# Patient Record
Sex: Female | Born: 1960 | Race: Black or African American | Hispanic: No | Marital: Single | Smoking: Current some day smoker
Health system: Southern US, Community
[De-identification: ages and names within clinical notes are randomized; demographics above are authoritative.]

## PROBLEM LIST (undated history)

## (undated) DIAGNOSIS — E559 Vitamin D deficiency, unspecified: Secondary | ICD-10-CM

## (undated) DIAGNOSIS — E785 Hyperlipidemia, unspecified: Secondary | ICD-10-CM

## (undated) HISTORY — DX: Hyperlipidemia, unspecified: E78.5

## (undated) HISTORY — DX: Vitamin D deficiency, unspecified: E55.9

---

## 2011-08-21 ENCOUNTER — Emergency Department: Payer: Self-pay | Admitting: Emergency Medicine

## 2013-04-14 ENCOUNTER — Emergency Department: Payer: Self-pay | Admitting: Emergency Medicine

## 2013-04-14 LAB — COMPREHENSIVE METABOLIC PANEL
Albumin: 3.3 g/dL — ABNORMAL LOW (ref 3.4–5.0)
Alkaline Phosphatase: 53 U/L (ref 50–136)
BUN: 5 mg/dL — ABNORMAL LOW (ref 7–18)
Bilirubin,Total: 0.2 mg/dL (ref 0.2–1.0)
Calcium, Total: 9 mg/dL (ref 8.5–10.1)
Chloride: 105 mmol/L (ref 98–107)
Co2: 26 mmol/L (ref 21–32)
Creatinine: 0.83 mg/dL (ref 0.60–1.30)
EGFR (African American): >60
EGFR (Non-African Amer.): >60
Potassium: 3.9 mmol/L (ref 3.5–5.1)
SGOT(AST): 19 U/L (ref 15–37)
SGPT (ALT): 14 U/L (ref 12–78)
Total Protein: 6.6 g/dL (ref 6.4–8.2)

## 2013-04-14 LAB — URINALYSIS, COMPLETE
Bacteria: NONE SEEN
Glucose,UR: NEGATIVE mg/dL (ref 0–75)
Ph: 6 (ref 4.5–8.0)
Protein: NEGATIVE
RBC,UR: 2 /HPF (ref 0–5)
Specific Gravity: 1.005 (ref 1.003–1.030)
Squamous Epithelial: 10
WBC UR: 1 /HPF (ref 0–5)

## 2013-04-14 LAB — CBC
HGB: 10 g/dL — ABNORMAL LOW (ref 12.0–16.0)
MCH: 27.3 pg (ref 26.0–34.0)
MCHC: 32.8 g/dL (ref 32.0–36.0)
MCV: 83 fL (ref 80–100)
Platelet: 288 10*3/uL (ref 150–440)
RBC: 3.65 10*6/uL — ABNORMAL LOW (ref 3.80–5.20)
RDW: 17.1 % — ABNORMAL HIGH (ref 11.5–14.5)
WBC: 4.1 10*3/uL (ref 3.6–11.0)

## 2013-04-14 LAB — TROPONIN I: Troponin-I: <0.02 ng/mL

## 2014-08-09 ENCOUNTER — Emergency Department: Payer: Self-pay | Admitting: Emergency Medicine

## 2015-02-08 ENCOUNTER — Emergency Department
Admission: EM | Admit: 2015-02-08 | Discharge: 2015-02-08 | Disposition: A | Discharge status: Home / Self Care | Payer: Self-pay | Attending: Emergency Medicine | Admitting: Emergency Medicine

## 2015-02-08 ENCOUNTER — Encounter: Payer: Self-pay | Admitting: Emergency Medicine

## 2015-02-08 DIAGNOSIS — Z720 Tobacco use: Secondary | ICD-10-CM | POA: Insufficient documentation

## 2015-02-08 DIAGNOSIS — Y9389 Activity, other specified: Secondary | ICD-10-CM | POA: Insufficient documentation

## 2015-02-08 DIAGNOSIS — X58XXXA Exposure to other specified factors, initial encounter: Secondary | ICD-10-CM | POA: Insufficient documentation

## 2015-02-08 DIAGNOSIS — S46011A Strain of muscle(s) and tendon(s) of the rotator cuff of right shoulder, initial encounter: Secondary | ICD-10-CM | POA: Insufficient documentation

## 2015-02-08 DIAGNOSIS — Y998 Other external cause status: Secondary | ICD-10-CM | POA: Insufficient documentation

## 2015-02-08 DIAGNOSIS — S43421A Sprain of right rotator cuff capsule, initial encounter: Secondary | ICD-10-CM | POA: Insufficient documentation

## 2015-02-08 DIAGNOSIS — Y9289 Other specified places as the place of occurrence of the external cause: Secondary | ICD-10-CM | POA: Insufficient documentation

## 2015-02-08 MED ORDER — NAPROXEN 500 MG PO TABS: 500.0000 mg | ORAL_TABLET | Freq: Two times a day (BID) | ORAL | Status: DC

## 2015-02-08 MED ORDER — HYDROCODONE-ACETAMINOPHEN 5-325 MG PO TABS: 1.0000 | ORAL_TABLET | ORAL | Status: DC | PRN

## 2015-02-08 NOTE — ED Notes (Signed)
C/o right shoulder pain and unable to move shoulder without assistance.  Denies any injury. Pain started about 2 weeks ago and progressively has become worse. Patient states she has taken several OTC medications to relieve pain but is not working.

## 2015-02-08 NOTE — Discharge Instructions (Signed)
Rotator Cuff Injury Rotator cuff injury is any type of injury to the set of muscles and tendons that make up the stabilizing unit of your shoulder. This unit holds the ball of your upper arm bone (humerus) in the socket of your shoulder blade (scapula).  CAUSES Injuries to your rotator cuff most commonly come from sports or activities that cause your arm to be moved repeatedly over your head. Examples of this include throwing, weight lifting, swimming, or racquet sports. Long lasting (chronic) irritation of your rotator cuff can cause soreness and swelling (inflammation), bursitis, and eventual damage to your tendons, such as a tear (rupture). SIGNS AND SYMPTOMS Acute rotator cuff tear:  Sudden tearing sensation followed by severe pain shooting from your upper shoulder down your arm toward your elbow.  Decreased range of motion of your shoulder because of pain and muscle spasm.  Severe pain.  Inability to raise your arm out to the side because of pain and loss of muscle power (large tears). Chronic rotator cuff tear:  Pain that usually is worse at night and may interfere with sleep.  Gradual weakness and decreased shoulder motion as the pain worsens.  Decreased range of motion. Rotator cuff tendinitis:  Deep ache in your shoulder and the outside upper arm over your shoulder.  Pain that comes on gradually and becomes worse when lifting your arm to the side or turning it inward. DIAGNOSIS Rotator cuff injury is diagnosed through a medical history, physical exam, and imaging exam. The medical history helps determine the type of rotator cuff injury. Your health care provider will look at your injured shoulder, feel the injured area, and ask you to move your shoulder in different positions. X-ray exams typically are done to rule out other causes of shoulder pain, such as fractures. MRI is the exam of choice for the most severe shoulder injuries because the images show muscles and tendons.    TREATMENT  Chronic tear:  Medicine for pain, such as acetaminophen or ibuprofen.  Physical therapy and range-of-motion exercises may be helpful in maintaining shoulder function and strength.  Steroid injections into your shoulder joint.  Surgical repair of the rotator cuff if the injury does not heal with noninvasive treatment. Acute tear:  Anti-inflammatory medicines such as ibuprofen and naproxen to help reduce pain and swelling.  A sling to help support your arm and rest your rotator cuff muscles. Long-term use of a sling is not advised. It may cause significant stiffening of the shoulder joint.  Surgery may be considered within a few weeks, especially in younger, active people, to return the shoulder to full function.  Indications for surgical treatment include the following:  Age younger than 60 years.  Rotator cuff tears that are complete.  Physical therapy, rest, and anti-inflammatory medicines have been used for 6-8 weeks, with no improvement.  Employment or sporting activity that requires constant shoulder use. Tendinitis:  Anti-inflammatory medicines such as ibuprofen and naproxen to help reduce pain and swelling.  A sling to help support your arm and rest your rotator cuff muscles. Long-term use of a sling is not advised. It may cause significant stiffening of the shoulder joint.  Severe tendinitis may require:  Steroid injections into your shoulder joint.  Physical therapy.  Surgery. HOME CARE INSTRUCTIONS   Apply ice to your injury:  Put ice in a plastic bag.  Place a towel between your skin and the bag.  Leave the ice on for 20 minutes, 2-3 times a day.  If you   have a shoulder immobilizer (sling and straps), wear it until told otherwise by your health care provider.  You may want to sleep on several pillows or in a recliner at night to lessen swelling and pain.  Only take over-the-counter or prescription medicines for pain, discomfort, or fever as  directed by your health care provider.  Do simple hand squeezing exercises with a soft rubber ball to decrease hand swelling. SEEK MEDICAL CARE IF:   Your shoulder pain increases, or new pain or numbness develops in your arm, hand, or fingers.  Your hand or fingers are colder than your other hand. SEEK IMMEDIATE MEDICAL CARE IF:   Your arm, hand, or fingers are numb or tingling.  Your arm, hand, or fingers are increasingly swollen and painful, or they turn white or blue. MAKE SURE YOU:  Understand these instructions.  Will watch your condition.  Will get help right away if you are not doing well or get worse. Document Released: 06/16/2000 Document Revised: 06/24/2013 Document Reviewed: 01/29/2013 [employer] Patient Information 2015 [employer]. This information is not intended to replace advice given to you by your health care provider. Make sure you discuss any questions you have with your health care provider.  

## 2015-02-08 NOTE — ED Provider Notes (Signed)
[facility] Emergency Department Provider Note  ____________________________________________  Time seen: On arrival  I have reviewed the triage vital signs and the nursing notes.   HISTORY  Chief Complaint Arm Pain    HPI Margaret Dean is a 54 y.o. female who presents with complaints of right shoulder pain for approximately 2-3 weeks. She does not remember any sort of injury. She denies fevers chills. No neck pain. No tingling in her hand. No weakness in her extremity. She is unable to lift her arm above 90 without significant pain.    History reviewed. No pertinent past medical history.  There are no active problems to display for this patient.   History reviewed. No pertinent past surgical history.  Current Outpatient Rx  Name  Route  Sig  Dispense  Refill  . HYDROcodone-acetaminophen (NORCO/VICODIN) 5-325 MG per tablet   Oral   Take 1 tablet by mouth every 4 (four) hours as needed for moderate pain.   20 tablet   0   . naproxen (NAPROSYN) 500 MG tablet   Oral   Take 1 tablet (500 mg total) by mouth 2 (two) times daily with a meal.   20 tablet   2     Allergies Review of patient's allergies indicates no known allergies.  No family history on file.  Social History History  Substance Use Topics  . Smoking status: Current Every Day Smoker -- 0.50 packs/day    Types: Cigarettes  . Smokeless tobacco: Not on file  . Alcohol Use: Not on file     Comment: pt reports having a glass of wine once a month    Review of Systems  Constitutional: Negative for fever. Eyes: Negative for visual changes. ENT: Negative for neck pain Musculoskeletal: Negative for back pain. Skin: Negative for rash. No swelling Neurological: Negative for headaches or focal weakness   ____________________________________________   PHYSICAL EXAM:  VITAL SIGNS: ED Triage Vitals  Enc Vitals Group     BP 02/08/15 1037 130/86 mmHg     Pulse Rate 02/08/15  1037 63     Resp --      Temp 02/08/15 1037 98.8 F (37.1 C)     Temp Source 02/08/15 1037 Oral     SpO2 02/08/15 1037 97 %     Weight 02/08/15 1037 160 lb (72.576 kg)     Height 02/08/15 1037 5\' 5"  (1.651 m)     Head Cir --      Peak Flow --      Pain Score 02/08/15 1038 10     Pain Loc --      Pain Edu? --      Excl. in GC? --      Constitutional: Alert and oriented. Well appearing and in no distress. Eyes: Conjunctivae are normal.  ENT   Head: Normocephalic and atraumatic.   Mouth/Throat: Mucous membranes are moist. Cardiovascular: Normal rate, regular rhythm.  Respiratory: Normal respiratory effort without tachypnea nor retractions.  Gastrointestinal: Soft and non-tender in all quadrants. No distention. There is no CVA tenderness. Musculoskeletal: Nontender with normal range of motion in all extremities. Patient unable to raise right upper extremity above 90 secondary to pain. She has some mild tenderness to palpation at the site of the right deltoid insertion. Normal triceps and biceps movements. Neurologic:  Normal speech and language. No gross focal neurologic deficits are appreciated. Skin:  Skin is warm, dry and intact. No rash noted. Psychiatric: Mood and affect are normal. Patient exhibits appropriate  insight and judgment.  ____________________________________________    LABS (pertinent positives/negatives)  Labs Reviewed - No data to display  ____________________________________________     ____________________________________________    RADIOLOGY I have personally reviewed any xrays that were ordered on this patient: None  ____________________________________________   PROCEDURES  Procedure(s) performed: none   ____________________________________________   INITIAL IMPRESSION / ASSESSMENT AND PLAN / ED COURSE  Pertinent labs & imaging results that were available during my care of the patient were reviewed by me and considered in my  medical decision making (see chart for details).  History of present illness an exam suspicious for rotator cuff injury. We will provide analgesic's and referred to orthopedics for further workup  ____________________________________________   FINAL CLINICAL IMPRESSION(S) / ED DIAGNOSES  Final diagnoses:  Rotator cuff (capsule) sprain and strain, right, initial encounter     Jene Every, MD 02/08/15 1132

## 2015-02-08 NOTE — ED Notes (Signed)
Pt reports R upper arm pain that started 2 weeks ago.  Pt unable use arm for normal everyday activities without pain.  Decreased range of motion.  Pt denies any known injury.

## 2015-11-16 ENCOUNTER — Emergency Department
Admission: EM | Admit: 2015-11-16 | Discharge: 2015-11-16 | Disposition: A | Discharge status: Home / Self Care | Payer: Self-pay | Attending: Emergency Medicine | Admitting: Emergency Medicine

## 2015-11-16 ENCOUNTER — Encounter: Payer: Self-pay | Admitting: Emergency Medicine

## 2015-11-16 DIAGNOSIS — A084 Viral intestinal infection, unspecified: Secondary | ICD-10-CM | POA: Insufficient documentation

## 2015-11-16 DIAGNOSIS — F17210 Nicotine dependence, cigarettes, uncomplicated: Secondary | ICD-10-CM | POA: Insufficient documentation

## 2015-11-16 DIAGNOSIS — Z79899 Other long term (current) drug therapy: Secondary | ICD-10-CM | POA: Insufficient documentation

## 2015-11-16 DIAGNOSIS — Z791 Long term (current) use of non-steroidal anti-inflammatories (NSAID): Secondary | ICD-10-CM | POA: Insufficient documentation

## 2015-11-16 LAB — LIPASE, BLOOD: LIPASE: 26 U/L (ref 11–51)

## 2015-11-16 LAB — COMPREHENSIVE METABOLIC PANEL
ALK PHOS: 39 U/L (ref 38–126)
ALT: 13 U/L — AB (ref 14–54)
AST: 16 U/L (ref 15–41)
Albumin: 3.8 g/dL (ref 3.5–5.0)
Anion gap: 5 (ref 5–15)
BILIRUBIN TOTAL: 0.4 mg/dL (ref 0.3–1.2)
BUN: 6 mg/dL (ref 6–20)
CALCIUM: 9.6 mg/dL (ref 8.9–10.3)
CHLORIDE: 106 mmol/L (ref 101–111)
CO2: 26 mmol/L (ref 22–32)
CREATININE: 0.68 mg/dL (ref 0.44–1.00)
Glucose, Bld: 97 mg/dL (ref 65–99)
Potassium: 3.9 mmol/L (ref 3.5–5.1)
Sodium: 137 mmol/L (ref 135–145)
TOTAL PROTEIN: 7.3 g/dL (ref 6.5–8.1)

## 2015-11-16 LAB — URINALYSIS COMPLETE WITH MICROSCOPIC (ARMC ONLY)
BILIRUBIN URINE: NEGATIVE
Bacteria, UA: NONE SEEN
Glucose, UA: NEGATIVE mg/dL
Hgb urine dipstick: NEGATIVE
KETONES UR: NEGATIVE mg/dL
LEUKOCYTES UA: NEGATIVE
Nitrite: NEGATIVE
PH: 8 (ref 5.0–8.0)
PROTEIN: NEGATIVE mg/dL
SPECIFIC GRAVITY, URINE: 1.005 (ref 1.005–1.030)

## 2015-11-16 LAB — CBC
HCT: 32.2 % — ABNORMAL LOW (ref 35.0–47.0)
Hemoglobin: 10.3 g/dL — ABNORMAL LOW (ref 12.0–16.0)
MCH: 27.4 pg (ref 26.0–34.0)
MCHC: 32 g/dL (ref 32.0–36.0)
MCV: 85.8 fL (ref 80.0–100.0)
PLATELETS: 354 10*3/uL (ref 150–440)
RBC: 3.76 MIL/uL — AB (ref 3.80–5.20)
RDW: 15.6 % — AB (ref 11.5–14.5)
WBC: 4.4 10*3/uL (ref 3.6–11.0)

## 2015-11-16 LAB — POCT PREGNANCY, URINE: Preg Test, Ur: NEGATIVE

## 2015-11-16 MED ORDER — PROMETHAZINE HCL 12.5 MG PO TABS: 12.5000 mg | ORAL_TABLET | Freq: Four times a day (QID) | ORAL | Status: DC | PRN

## 2015-11-16 NOTE — ED Provider Notes (Signed)
[facility] Emergency Department Provider Note  ____________________________________________  Time seen: Approximately 2:37 PM  I have reviewed the triage vital signs and the nursing notes.   HISTORY  Chief Complaint Diarrhea; Vomiting; and Abdominal Pain    HPI Margaret Dean is a 55 y.o. female with no significant past medical history other than current tobacco use who presents with gradual onset of nausea, vomiting, diarrhea, and lower abdominal cramping for about 3 days.  She reports that her niece and nephew visited a day or 2 prior to the onset of symptoms and they have had all the same symptoms.  She reports that she is not able to work yesterday or today due to the frequent diarrhea.  She reports that the symptoms are severe in terms of the frequency of vomiting and diarrhea, although she states the vomiting is nearly resolved and is more the diarrhea that remains a problem.  There has been no blood in her stool, just loose and watery.  The abdominal cramping is moderate and occurs acutely and results in the need to run to the bathroom quickly.  She denies fever/chills, chest pain, shortness of breath, dysuria.  She has not been feeling lightheaded or dizzy.  She reports that she has been eating saltine crackers and trying to drink plenty of water.   History reviewed. No pertinent past medical history.  There are no active problems to display for this patient.   History reviewed. No pertinent past surgical history.  Current Outpatient Rx  Name  Route  Sig  Dispense  Refill  . HYDROcodone-acetaminophen (NORCO/VICODIN) 5-325 MG per tablet   Oral   Take 1 tablet by mouth every 4 (four) hours as needed for moderate pain.   20 tablet   0   . naproxen (NAPROSYN) 500 MG tablet   Oral   Take 1 tablet (500 mg total) by mouth 2 (two) times daily with a meal.   20 tablet   2   . promethazine (PHENERGAN) 12.5 MG tablet   Oral   Take 1 tablet (12.5 mg  total) by mouth every 6 (six) hours as needed for nausea or vomiting.   30 tablet   0     Allergies Review of patient's allergies indicates no known allergies.  History reviewed. No pertinent family history.  Social History Social History  Substance Use Topics  . Smoking status: Current Every Day Smoker -- 0.50 packs/day    Types: Cigarettes  . Smokeless tobacco: None  . Alcohol Use: None     Comment: pt reports having a glass of wine once a month    Review of Systems Constitutional: No fever/chills Eyes: No visual changes. ENT: No sore throat. Cardiovascular: Denies chest pain. Respiratory: Denies shortness of breath. Gastrointestinal: N/V/D w/ abd cramps Genitourinary: Negative for dysuria. Musculoskeletal: Negative for back pain. Skin: Negative for rash. Neurological: Negative for headaches, focal weakness or numbness.  10-point ROS otherwise negative.  ____________________________________________   PHYSICAL EXAM:  VITAL SIGNS: ED Triage Vitals  Enc Vitals Group     BP 11/16/15 1121 141/90 mmHg     Pulse Rate 11/16/15 1121 77     Resp 11/16/15 1121 20     Temp 11/16/15 1121 98.9 F (37.2 C)     Temp Source 11/16/15 1121 Oral     SpO2 11/16/15 1121 100 %     Weight 11/16/15 1121 165 lb (74.844 kg)     Height 11/16/15 1121  (1.651 m)  Head Cir --      Peak Flow --      Pain Score 11/16/15 1126 7     Pain Loc --      Pain Edu? --      Excl. in GC? --     Constitutional: Alert and oriented. Well appearing and in no acute distress but tearful. Eyes: Conjunctivae are normal. PERRL. EOMI. Head: Atraumatic. Nose: No congestion/rhinnorhea. Mouth/Throat: Mucous membranes are moist.  Oropharynx non-erythematous. Neck: No stridor.  No meningeal signs.   Cardiovascular: Normal rate, regular rhythm. Good peripheral circulation. Grossly normal heart sounds.   Respiratory: Normal respiratory effort.  No retractions. Lungs CTAB. Gastrointestinal: Soft and  nontender. No distention.  Musculoskeletal: No lower extremity tenderness nor edema. No gross deformities of extremities. Neurologic:  Normal speech and language. No gross focal neurologic deficits are appreciated.  Skin:  Skin is warm, dry and intact. No rash noted. Psychiatric: Mood and affect are tearful, reports she is under a lot of stress and "just not feeling well"  ____________________________________________   LABS (all labs ordered are listed, but only abnormal results are displayed)  Labs Reviewed  COMPREHENSIVE METABOLIC PANEL - Abnormal; Notable for the following:    ALT 13 (*)    All other components within normal limits  CBC - Abnormal; Notable for the following:    RBC 3.76 (*)    Hemoglobin 10.3 (*)    HCT 32.2 (*)    RDW 15.6 (*)    All other components within normal limits  URINALYSIS COMPLETEWITH MICROSCOPIC ([facility] ONLY) - Abnormal; Notable for the following:    Color, Urine STRAW (*)    APPearance CLEAR (*)    Squamous Epithelial / LPF 0-5 (*)    All other components within normal limits  LIPASE, BLOOD  POC URINE PREG, ED  POCT PREGNANCY, URINE   ____________________________________________  EKG  None ____________________________________________  RADIOLOGY   No results found.  ____________________________________________   PROCEDURES  Procedure(s) performed: None  Critical Care performed: No ____________________________________________   INITIAL IMPRESSION / ASSESSMENT AND PLAN / ED COURSE  Pertinent labs & imaging results that were available during my care of the patient were reviewed by me and considered in my medical decision making (see chart for details).  The patient's vital signs are normal and her lab workup was reassuring.  She and I both agree she does not need any additional workup at this time including no stool cultures.  I gave her my usual and customary management advice and return precautions.  She understands and  agrees.   ____________________________________________  FINAL CLINICAL IMPRESSION(S) / ED DIAGNOSES  Final diagnoses:  Viral gastroenteritis     MEDICATIONS GIVEN DURING THIS VISIT:  Medications - No data to display   NEW OUTPATIENT MEDICATIONS STARTED DURING THIS VISIT:  Discharge Medication List as of 11/16/2015  2:51 PM    START taking these medications   Details  promethazine (PHENERGAN) 12.5 MG tablet Take 1 tablet (12.5 mg total) by mouth every 6 (six) hours as needed for nausea or vomiting., Starting 11/16/2015, Until Discontinued, Print          Note:  This document was prepared using Dragon voice recognition software and may include unintentional dictation errors.   Loleta Roseory Lynasia Meloche, MD 11/16/15 [phone]

## 2015-11-16 NOTE — ED Notes (Signed)
Pt informed to return if any life threatening symptoms occur.  

## 2015-11-16 NOTE — Discharge Instructions (Signed)
We believe your symptoms are caused by either a viral infection or possible a bad food exposure.  Either way, since your symptoms have improved, we feel it is safe for you to go home and follow up with your regular doctor.  Please read the included information and stick to a bland diet for the next two days.  Drink plenty of clear fluids, and if you were provided with a prescription, please take it according to the label instructions.   ° °If you develop any new or worsening symptoms, including persistent vomiting not controlled with medication, fever greater than 101, severe or worsening abdominal pain, or other symptoms that concern you, please return immediately to the Emergency Department. ° °Viral Gastroenteritis  °Viral gastroenteritis is also known as stomach flu. This condition affects the stomach and intestinal tract. It can cause sudden diarrhea and vomiting. The illness typically lasts 3 to 8 days. Most people develop an immune response that eventually gets rid of the virus. While this natural response develops, the virus can make you quite ill.  °CAUSES  °Many different viruses can cause gastroenteritis, such as rotavirus or noroviruses. You can catch one of these viruses by consuming contaminated food or water. You may also catch a virus by sharing utensils or other personal items with an infected person or by touching a contaminated surface.  °SYMPTOMS  °The most common symptoms are diarrhea and vomiting. These problems can cause a severe loss of body fluids (dehydration) and a body salt (electrolyte) imbalance. Other symptoms may include:  °Fever.  °Headache.  °Fatigue.  °Abdominal pain. °DIAGNOSIS  °Your caregiver can usually diagnose viral gastroenteritis based on your symptoms and a physical exam. A stool sample may also be taken to test for the presence of viruses or other infections.  °TREATMENT  °This illness typically goes away on its own. Treatments are aimed at rehydration. The most serious  cases of viral gastroenteritis involve vomiting so severely that you are not able to keep fluids down. In these cases, fluids must be given through an intravenous line (IV).  °HOME CARE INSTRUCTIONS  °Drink enough fluids to keep your urine clear or pale yellow. Drink small amounts of fluids frequently and increase the amounts as tolerated.  °Ask your caregiver for specific rehydration instructions.  °Avoid:  °Foods high in sugar.  °Alcohol.  °Carbonated drinks.  °Tobacco.  °Juice.  °Caffeine drinks.  °Extremely hot or cold fluids.  °Fatty, greasy foods.  °Too much intake of anything at one time.  °Dairy products until 24 to 48 hours after diarrhea stops. °You may consume probiotics. Probiotics are active cultures of beneficial bacteria. They may lessen the amount and number of diarrheal stools in adults. Probiotics can be found in yogurt with active cultures and in supplements.  °Wash your hands well to avoid spreading the virus.  °Only take over-the-counter or prescription medicines for pain, discomfort, or fever as directed by your caregiver. Do not give aspirin to children. Antidiarrheal medicines are not recommended.  °Ask your caregiver if you should continue to take your regular prescribed and over-the-counter medicines.  °Keep all follow-up appointments as directed by your caregiver. °SEEK IMMEDIATE MEDICAL CARE IF:  °You are unable to keep fluids down.  °You do not urinate at least once every 6 to 8 hours.  °You develop shortness of breath.  °You notice blood in your stool or vomit. This may look like coffee grounds.  °You have abdominal pain that increases or is concentrated in one small area (  localized).  °You have persistent vomiting or diarrhea.  °You have a fever.  °The patient is a child younger than 3 months, and he or she has a fever.  °The patient is a child older than 3 months, and he or she has a fever and persistent symptoms.  °The patient is a child older than 3 months, and he or she has a fever  and symptoms suddenly get worse.  °The patient is a baby, and he or she has no tears when crying. °MAKE SURE YOU:  °Understand these instructions.  °Will watch your condition.  °Will get help right away if you are not doing well or get worse. °This information is not intended to replace advice given to you by your health care provider. Make sure you discuss any questions you have with your health care provider.  °Document Released: 06/19/2005 Document Revised: 09/11/2011 Document Reviewed: 04/05/2011  °Elsevier Interactive Patient Education ©2016 [employer]  ° °

## 2015-11-16 NOTE — ED Notes (Signed)
Pt to ed with c./o vomiting and diarrhea.  Reports diarrhea x 3 in the last 24 hours.  Reports vomiting x 2 in the last 24 hours.  Pt also reports abd pain, states family members have similar symptoms.

## 2017-02-26 ENCOUNTER — Emergency Department
Admission: EM | Admit: 2017-02-26 | Discharge: 2017-02-26 | Disposition: A | Discharge status: Home / Self Care | Payer: PRIVATE HEALTH INSURANCE | Attending: Emergency Medicine | Admitting: Emergency Medicine

## 2017-02-26 ENCOUNTER — Emergency Department: Payer: PRIVATE HEALTH INSURANCE

## 2017-02-26 ENCOUNTER — Encounter: Payer: Self-pay | Admitting: Medical Oncology

## 2017-02-26 DIAGNOSIS — H6692 Otitis media, unspecified, left ear: Secondary | ICD-10-CM | POA: Diagnosis not present

## 2017-02-26 DIAGNOSIS — F17210 Nicotine dependence, cigarettes, uncomplicated: Secondary | ICD-10-CM | POA: Insufficient documentation

## 2017-02-26 DIAGNOSIS — Z791 Long term (current) use of non-steroidal anti-inflammatories (NSAID): Secondary | ICD-10-CM | POA: Insufficient documentation

## 2017-02-26 DIAGNOSIS — J069 Acute upper respiratory infection, unspecified: Secondary | ICD-10-CM | POA: Diagnosis not present

## 2017-02-26 DIAGNOSIS — H9202 Otalgia, left ear: Secondary | ICD-10-CM | POA: Diagnosis present

## 2017-02-26 MED ORDER — PSEUDOEPH-BROMPHEN-DM 30-2-10 MG/5ML PO SYRP: 5.0000 mL | ORAL_SOLUTION | Freq: Four times a day (QID) | ORAL | 0 refills | Status: DC | PRN

## 2017-02-26 MED ORDER — AMOXICILLIN 500 MG PO TABS: 500.0000 mg | ORAL_TABLET | Freq: Three times a day (TID) | ORAL | 0 refills | Status: DC

## 2017-02-26 NOTE — ED Provider Notes (Signed)
[facility] Emergency Department Provider Note  ____________________________________________  Time seen: Approximately 4:13 PM  I have reviewed the triage vital signs and the nursing notes.   HISTORY  Chief Complaint Otalgia and Nasal Congestion   HPI Margaret Dean is a 56 y.o. female who presents to the emergency department for evaluation of cough and earache. She states that the cough started more than a week ago. She states that she's had some sinus drainage that seems to trigger the cough. She states that the left ear began to hurt yesterday. No known fever. She does smoke cigarettes and admits to an increased number of cigarettes per day due to some life stressors. She has taken over-the-counter cold medications without relief.   History reviewed. No pertinent past medical history.  There are no active problems to display for this patient.   No past surgical history on file.  Prior to Admission medications   Medication Sig Start Date End Date Taking? Authorizing Provider  amoxicillin (AMOXIL) 500 MG tablet Take 1 tablet (500 mg total) by mouth 3 (three) times daily. 02/26/17   Kensleigh Gates, Rulon Eisenmenger B, FNP  brompheniramine-pseudoephedrine-DM 30-2-10 MG/5ML syrup Take 5 mLs by mouth 4 (four) times daily as needed. 02/26/17   Anik Wesch, Rulon Eisenmenger B, FNP  HYDROcodone-acetaminophen (NORCO/VICODIN) 5-325 MG per tablet Take 1 tablet by mouth every 4 (four) hours as needed for moderate pain. 02/08/15   Jene Every, MD  naproxen (NAPROSYN) 500 MG tablet Take 1 tablet (500 mg total) by mouth 2 (two) times daily with a meal. 02/08/15   Jene Every, MD  promethazine (PHENERGAN) 12.5 MG tablet Take 1 tablet (12.5 mg total) by mouth every 6 (six) hours as needed for nausea or vomiting. 11/16/15   Loleta Rose, MD    Allergies Patient has no known allergies.  No family history on file.  Social History Social History  Substance Use Topics  . Smoking status: Current Every  Day Smoker    Packs/day: 0.50    Types: Cigarettes  . Smokeless tobacco: Not on file  . Alcohol use Not on file     Comment: pt reports having a glass of wine once a month    Review of Systems Constitutional: Negative for fever/chills ENT: Negative for sore throat. Cardiovascular: Denies chest pain. Respiratory: Negative for shortness of breath. Positive for cough. Gastrointestinal: Negative nausea,  no vomiting.  No diarrhea.  Musculoskeletal: Negative for body aches Skin: Negative for rash. Neurological: Negative for headaches ____________________________________________   PHYSICAL EXAM:  VITAL SIGNS: ED Triage Vitals  Enc Vitals Group     BP 02/26/17 1508 119/70     Pulse Rate 02/26/17 1508 85     Resp 02/26/17 1508 16     Temp 02/26/17 1508 98.5 F (36.9 C)     Temp Source 02/26/17 1508 Oral     SpO2 02/26/17 1508 98 %     Weight 02/26/17 1509 140 lb (63.5 kg)     Height 02/26/17 1509 5\' 5"  (1.651 m)     Head Circumference --      Peak Flow --      Pain Score 02/26/17 1508 7     Pain Loc --      Pain Edu? --      Excl. in GC? --     Constitutional: Alert and oriented. Well appearing and in no acute distress. Eyes: Conjunctivae are normal. EOMI. Ears: Right tympanic membrane is normal in appearance, left tympanic membrane is erythematous and dull. Nose:  No congestion noted; clear rhinnorhea. Mouth/Throat: Mucous membranes are moist.  Oropharynx with cobblestoning drainage. Tonsils not visualized. Neck: No stridor.  Lymphatic: No cervical lymphadenopathy. Cardiovascular: Normal rate, regular rhythm. Good peripheral circulation. Respiratory: Normal respiratory effort.  No retractions. Faint, expiratory wheeze noted in the left lower lobe, otherwise breath sounds are clear. Gastrointestinal: Soft and nontender.  Musculoskeletal: FROM x 4 extremities.  Neurologic:  Normal speech and language.  Skin:  Skin is warm, dry and intact. No rash noted. Psychiatric: Mood  and affect are normal. Speech and behavior are normal.  ____________________________________________   LABS (all labs ordered are listed, but only abnormal results are displayed)  Labs Reviewed - No data to display ____________________________________________  EKG  Not indicated ____________________________________________  RADIOLOGY  Chest x-ray negative for acute cardiopulmonary abnormality per radiology. ____________________________________________   PROCEDURES  Procedure(s) performed: None  Critical Care performed: No ____________________________________________   INITIAL IMPRESSION / ASSESSMENT AND PLAN / ED COURSE  56 year old female presenting to the emergency department for treatment evaluation of cough that has been ongoing for the past 10 days and acute onset of a child of the left ear. X-ray shows no concern of pneumonia. She will be treated with amoxicillin and Bromfed. She was advised to establish a primary care provider and follow up if not improving over the next few days. She was advised to return to the emergency department for symptoms that change or worsen if she is unable to schedule appointment.  Pertinent labs & imaging results that were available during my care of the patient were reviewed by me and considered in my medical decision making (see chart for details).  Discharge Medication List as of 02/26/2017  5:24 PM    START taking these medications   Details  amoxicillin (AMOXIL) 500 MG tablet Take 1 tablet (500 mg total) by mouth 3 (three) times daily., Starting Mon 02/26/2017, Print    brompheniramine-pseudoephedrine-DM 30-2-10 MG/5ML syrup Take 5 mLs by mouth 4 (four) times daily as needed., Starting Mon 02/26/2017, Print        If controlled substance prescribed during this visit, 12 month history viewed on the [facility] prior to issuing an initial prescription for Schedule II or III opiod. ____________________________________________   FINAL  CLINICAL IMPRESSION(S) / ED DIAGNOSES  Final diagnoses:  Upper respiratory tract infection, unspecified type  Left otitis media, unspecified otitis media type    Note:  This document was prepared using Dragon voice recognition software and may include unintentional dictation errors.     Chinita Pester, FNP 02/26/17 1947    Nita Sickle, MD 02/28/17 [phone]

## 2017-02-26 NOTE — Discharge Instructions (Signed)
Follow-up with the primary care provider of her choice for symptoms that are not improving over the next few days. Return to the emergency department for symptoms that change or worsen if you're unable to schedule an appointment.

## 2017-02-26 NOTE — ED Notes (Addendum)
Pt c/o bilat ear pain but L more than R. Pt has congested productive cough for " a while". Pt reports symptoms x 1 week. Pt has been taking OTC sinus medication. Alert, oriented, ambulatory.

## 2017-02-26 NOTE — ED Triage Notes (Signed)
Pt reports that she began having nasal congestion and drainage with Bilt ear pain a couple of days ago. Pt in NAD at this time.

## 2019-01-06 ENCOUNTER — Other Ambulatory Visit: Payer: Self-pay

## 2019-01-13 ENCOUNTER — Other Ambulatory Visit: Payer: Self-pay

## 2019-01-24 ENCOUNTER — Other Ambulatory Visit: Payer: Self-pay

## 2019-01-24 ENCOUNTER — Ambulatory Visit (LOCAL_COMMUNITY_HEALTH_CENTER): Payer: Self-pay

## 2019-01-24 DIAGNOSIS — Z111 Encounter for screening for respiratory tuberculosis: Secondary | ICD-10-CM

## 2019-01-24 NOTE — Patient Instructions (Signed)
Client to return on 01/27/2019 for skin test reading and has appt reminder. Shona Needles, RN

## 2019-01-27 ENCOUNTER — Other Ambulatory Visit: Payer: Self-pay

## 2019-01-27 ENCOUNTER — Ambulatory Visit (LOCAL_COMMUNITY_HEALTH_CENTER): Payer: Self-pay

## 2019-01-27 DIAGNOSIS — Z111 Encounter for screening for respiratory tuberculosis: Secondary | ICD-10-CM

## 2019-01-27 LAB — TB SKIN TEST
Induration: 0 mm
TB Skin Test: NEGATIVE

## 2019-07-13 ENCOUNTER — Ambulatory Visit
Admission: EM | Admit: 2019-07-13 | Discharge: 2019-07-13 | Discharge status: Against Medical Advice | Payer: PRIVATE HEALTH INSURANCE

## 2019-07-13 ENCOUNTER — Other Ambulatory Visit: Payer: Self-pay

## 2019-07-14 ENCOUNTER — Ambulatory Visit: Payer: PRIVATE HEALTH INSURANCE | Attending: Internal Medicine

## 2019-07-14 DIAGNOSIS — Z20822 Contact with and (suspected) exposure to covid-19: Secondary | ICD-10-CM

## 2019-07-15 ENCOUNTER — Ambulatory Visit: Payer: Self-pay | Admitting: *Deleted

## 2019-07-15 LAB — NOVEL CORONAVIRUS, NAA: SARS-CoV-2, NAA: NOT DETECTED

## 2019-07-15 NOTE — Telephone Encounter (Signed)
  Pt calling for COVID results that are not back yet. Verbalized understanding. Reason for Disposition . COVID-19 Testing, questions about  Answer Assessment - Initial Assessment Questions 1. COVID-19 DIAGNOSIS: "Who made your Coronavirus (COVID-19) diagnosis?" "Was it confirmed by a positive lab test?" If not diagnosed by a HCP, ask "Are there lots of cases (community spread) where you live?" (See public health department website, if unsure)     [place] 2. COVID-19 EXPOSURE: "Was there any known exposure to COVID before the symptoms began?" CDC Definition of close contact: within 6 feet (2 meters) for a total of 15 minutes or more over a 24-hour period.      na 3. ONSET: "When did the COVID-19 symptoms start?"      na 4. WORST SYMPTOM: "What is your worst symptom?" (e.g., cough, fever, shortness of breath, muscle aches)     na 5. COUGH: "Do you have a cough?" If so, ask: "How bad is the cough?"       na 6. FEVER: "Do you have a fever?" If so, ask: "What is your temperature, how was it measured, and when did it start?"     na 7. RESPIRATORY STATUS: "Describe your breathing?" (e.g., shortness of breath, wheezing, unable to speak)      na 8. BETTER-SAME-WORSE: "Are you getting better, staying the same or getting worse compared to yesterday?"  If getting worse, ask, "In what way?"     na 9. HIGH RISK DISEASE: "Do you have any chronic medical problems?" (e.g., asthma, heart or lung disease, weak immune system, obesity, etc.)     na 10. PREGNANCY: "Is there any chance you are pregnant?" "When was your last menstrual period?"       na 11. OTHER SYMPTOMS: "Do you have any other symptoms?"  (e.g., chills, fatigue, headache, loss of smell or taste, muscle pain, sore throat; new loss of smell or taste especially support the diagnosis of COVID-19)       na  Protocols used: CORONAVIRUS (COVID-19) DIAGNOSED OR SUSPECTED-A-AH

## 2019-07-16 ENCOUNTER — Telehealth: Payer: Self-pay

## 2019-07-16 NOTE — Telephone Encounter (Signed)
Patient given negative result and verbalized understanding  

## 2019-07-17 ENCOUNTER — Telehealth: Payer: Self-pay

## 2019-07-17 NOTE — Telephone Encounter (Signed)
Please fax result [employer] AttnByrd Hesselbach Fax: [phone]

## 2019-07-17 NOTE — Telephone Encounter (Signed)
Result faxed as requested. 

## 2019-08-25 ENCOUNTER — Other Ambulatory Visit: Payer: Self-pay

## 2019-08-25 ENCOUNTER — Ambulatory Visit: Payer: PRIVATE HEALTH INSURANCE | Attending: Internal Medicine

## 2019-08-25 DIAGNOSIS — Z20822 Contact with and (suspected) exposure to covid-19: Secondary | ICD-10-CM

## 2019-08-26 LAB — NOVEL CORONAVIRUS, NAA: SARS-CoV-2, NAA: NOT DETECTED

## 2019-11-11 ENCOUNTER — Other Ambulatory Visit: Payer: Self-pay

## 2019-11-11 ENCOUNTER — Emergency Department: Payer: PRIVATE HEALTH INSURANCE

## 2019-11-11 ENCOUNTER — Emergency Department
Admission: EM | Admit: 2019-11-11 | Discharge: 2019-11-11 | Disposition: A | Discharge status: Home / Self Care | Payer: PRIVATE HEALTH INSURANCE | Attending: Emergency Medicine | Admitting: Emergency Medicine

## 2019-11-11 DIAGNOSIS — R0600 Dyspnea, unspecified: Secondary | ICD-10-CM | POA: Insufficient documentation

## 2019-11-11 DIAGNOSIS — M791 Myalgia, unspecified site: Secondary | ICD-10-CM

## 2019-11-11 DIAGNOSIS — M7918 Myalgia, other site: Secondary | ICD-10-CM | POA: Insufficient documentation

## 2019-11-11 DIAGNOSIS — Z20822 Contact with and (suspected) exposure to covid-19: Secondary | ICD-10-CM | POA: Insufficient documentation

## 2019-11-11 DIAGNOSIS — F17210 Nicotine dependence, cigarettes, uncomplicated: Secondary | ICD-10-CM | POA: Insufficient documentation

## 2019-11-11 LAB — BASIC METABOLIC PANEL
Anion gap: 11 (ref 5–15)
BUN: 12 mg/dL (ref 6–20)
CO2: 22 mmol/L (ref 22–32)
Calcium: 10.1 mg/dL (ref 8.9–10.3)
Chloride: 105 mmol/L (ref 98–111)
Creatinine, Ser: 0.89 mg/dL (ref 0.44–1.00)
GFR calc Af Amer: >60 mL/min (ref >60)
GFR calc non Af Amer: >60 mL/min (ref >60)
Glucose, Bld: 83 mg/dL (ref 70–99)
Potassium: 3.8 mmol/L (ref 3.5–5.1)
Sodium: 138 mmol/L (ref 135–145)

## 2019-11-11 LAB — CBC WITH DIFFERENTIAL/PLATELET
Abs Immature Granulocytes: 0 10*3/uL (ref 0.00–0.07)
Basophils Absolute: 0 10*3/uL (ref 0.0–0.1)
Basophils Relative: 1 %
Eosinophils Absolute: 0.1 10*3/uL (ref 0.0–0.5)
Eosinophils Relative: 3 %
HCT: 42.7 % (ref 36.0–46.0)
Hemoglobin: 14.5 g/dL (ref 12.0–15.0)
Immature Granulocytes: 0 %
Lymphocytes Relative: 47 %
Lymphs Abs: 2.4 10*3/uL (ref 0.7–4.0)
MCH: 32.2 pg (ref 26.0–34.0)
MCHC: 34 g/dL (ref 30.0–36.0)
MCV: 94.9 fL (ref 80.0–100.0)
Monocytes Absolute: 0.4 10*3/uL (ref 0.1–1.0)
Monocytes Relative: 7 %
Neutro Abs: 2.1 10*3/uL (ref 1.7–7.7)
Neutrophils Relative %: 42 %
Platelets: 239 10*3/uL (ref 150–400)
RBC: 4.5 MIL/uL (ref 3.87–5.11)
RDW: 13.8 % (ref 11.5–15.5)
WBC: 5.1 10*3/uL (ref 4.0–10.5)
nRBC: 0 % (ref 0.0–0.2)

## 2019-11-11 LAB — BRAIN NATRIURETIC PEPTIDE: B Natriuretic Peptide: 40 pg/mL (ref 0.0–100.0)

## 2019-11-11 LAB — SARS CORONAVIRUS 2 BY RT PCR (HOSPITAL ORDER, PERFORMED IN ~~LOC~~ HOSPITAL LAB): SARS Coronavirus 2: NEGATIVE

## 2019-11-11 LAB — TROPONIN I (HIGH SENSITIVITY): Troponin I (High Sensitivity): 4 ng/L (ref <18)

## 2019-11-11 MED ORDER — ALBUTEROL SULFATE (2.5 MG/3ML) 0.083% IN NEBU: 5.0000 mg | INHALATION_SOLUTION | Freq: Once | RESPIRATORY_TRACT | Status: DC

## 2019-11-11 NOTE — ED Triage Notes (Signed)
Pt arrives POV for c/o shob, cough, and body aches x 2 days, no fever. Pt reports left lower abdominal pain accompanied with decreased appetite. Diarrhea and nausea but no vomiting. Pt A&Ox4 and in NAD.

## 2019-11-11 NOTE — ED Provider Notes (Signed)
[facility] Emergency Department Provider Note       Time seen: ----------------------------------------- 1:03 PM on 11/11/2019 -----------------------------------------   I have reviewed the triage vital signs and the nursing notes. HISTORY   Chief Complaint Shortness of Breath   HPI Margaret Dean is a 59 y.o. female with no known past medical history who presents to the ED for shortness of breath, cough and body aches for the last 2 days.  Patient has not had a fever.  Reports lower abdominal pain accompanied by decreased appetite.  Describes generalized aches that are 10 out of 10.  History reviewed. No pertinent past medical history.  There are no problems to display for this patient.   History reviewed. No pertinent surgical history.  Allergies Patient has no known allergies.  Social History Social History   Tobacco Use  . Smoking status: Current Every Day Smoker    Packs/day: 0.50    Types: Cigarettes  Substance Use Topics  . Alcohol use: Not on file    Comment: pt reports having a glass of wine once a month  . Drug use: No    Review of Systems Constitutional: Negative for fever.  Positive for aches Cardiovascular: Negative for chest pain. Respiratory: Positive for shortness of breath, cough Gastrointestinal: Negative for abdominal pain, vomiting and diarrhea. Musculoskeletal: Negative for back pain. Skin: Negative for rash. Neurological: Negative for headaches, focal weakness or numbness.  All systems negative/normal/unremarkable except as stated in the HPI  ____________________________________________   PHYSICAL EXAM:  VITAL SIGNS: ED Triage Vitals  Enc Vitals Group     BP 11/11/19 1100 (!) 173/93     Pulse Rate 11/11/19 1100 69     Resp 11/11/19 1100 18     Temp 11/11/19 1100 98.7 F (37.1 C)     Temp Source 11/11/19 1100 Oral     SpO2 11/11/19 1100 100 %     Weight 11/11/19 1100 160 lb (72.6 kg)     Height  11/11/19 1100 5\' 5"  (1.651 m)     Head Circumference --      Peak Flow --      Pain Score 11/11/19 1110 10     Pain Loc --      Pain Edu? --      Excl. in GC? --     Constitutional: Alert and oriented. Well appearing and in no distress. Eyes: Conjunctivae are normal. Normal extraocular movements. ENT      Head: Normocephalic and atraumatic.      Nose: No congestion/rhinnorhea.      Mouth/Throat: Mucous membranes are moist.      Neck: No stridor. Cardiovascular: Normal rate, regular rhythm. No murmurs, rubs, or gallops. Respiratory: Normal respiratory effort without tachypnea nor retractions. Breath sounds are clear and equal bilaterally. No wheezes/rales/rhonchi. Gastrointestinal: Soft and nontender. Normal bowel sounds Musculoskeletal: Nontender with normal range of motion in extremities. No lower extremity tenderness nor edema. Neurologic:  Normal speech and language. No gross focal neurologic deficits are appreciated.  Skin:  Skin is warm, dry and intact. No rash noted. Psychiatric: Mood and affect are normal. Speech and behavior are normal.  ____________________________________________  EKG: Interpreted by me.  Positive for sinus bradycardia with rate of 59 bpm, normal axis, normal QT, nonspecific T wave abnormality  ____________________________________________  ED COURSE:  As part of my medical decision making, I reviewed the following data within the electronic MEDICAL RECORD NUMBER History obtained from family if available, nursing notes, old chart and ekg,  as well as notes from prior ED visits. Patient presented for chest pain and body aches, we will assess with labs and imaging as indicated at this time.   Procedures  Margaret Dean was evaluated in Emergency Department on 11/11/2019 for the symptoms described in the history of present illness. She was evaluated in the context of the global COVID-19 pandemic, which necessitated consideration that the patient might be at risk  for infection with the SARS-CoV-2 virus that causes COVID-19. Institutional protocols and algorithms that pertain to the evaluation of patients at risk for COVID-19 are in a state of rapid change based on information released by regulatory bodies including the CDC and federal and state organizations. These policies and algorithms were followed during the patient's care in the ED.  ____________________________________________   LABS (pertinent positives/negatives)  Labs Reviewed  SARS CORONAVIRUS 2 BY RT PCR ([facility])  CBC WITH DIFFERENTIAL/PLATELET  BASIC METABOLIC PANEL  BRAIN NATRIURETIC PEPTIDE  TROPONIN I (HIGH SENSITIVITY)    RADIOLOGY  Chest x-ray IMPRESSION: No radiographic evidence of acute cardiopulmonary disease. ____________________________________________   DIFFERENTIAL DIAGNOSIS   Musculoskeletal pain, GERD, anxiety, unstable angina, MI, pneumonia, COVID-19, viral illness  FINAL ASSESSMENT AND PLAN  Myalgias, dyspnea   Plan: The patient had presented for multiple complaints. Patient's labs were grossly unremarkable. Patient's imaging also did not reveal any acute process.  She will be discharged with outpatient follow-up   Laurence Aly, MD    Note: This note was generated in part or whole with voice recognition software. Voice recognition is usually quite accurate but there are transcription errors that can and very often do occur. I apologize for any typographical errors that were not detected and corrected.     Earleen Newport, MD 11/11/19 1435

## 2020-03-15 ENCOUNTER — Other Ambulatory Visit: Payer: Self-pay | Admitting: Nurse Practitioner

## 2020-03-15 DIAGNOSIS — Z1231 Encounter for screening mammogram for malignant neoplasm of breast: Secondary | ICD-10-CM

## 2020-04-05 ENCOUNTER — Ambulatory Visit: Payer: Self-pay | Attending: Nurse Practitioner

## 2021-08-13 ENCOUNTER — Emergency Department: Payer: BC Managed Care – PPO

## 2021-08-13 ENCOUNTER — Encounter: Payer: Self-pay | Admitting: Emergency Medicine

## 2021-08-13 ENCOUNTER — Other Ambulatory Visit: Payer: Self-pay

## 2021-08-13 ENCOUNTER — Emergency Department
Admission: EM | Admit: 2021-08-13 | Discharge: 2021-08-13 | Disposition: A | Discharge status: Home / Self Care | Payer: BC Managed Care – PPO | Attending: Emergency Medicine | Admitting: Emergency Medicine

## 2021-08-13 DIAGNOSIS — R059 Cough, unspecified: Secondary | ICD-10-CM | POA: Diagnosis present

## 2021-08-13 DIAGNOSIS — Z20822 Contact with and (suspected) exposure to covid-19: Secondary | ICD-10-CM | POA: Insufficient documentation

## 2021-08-13 DIAGNOSIS — R0602 Shortness of breath: Secondary | ICD-10-CM | POA: Diagnosis not present

## 2021-08-13 DIAGNOSIS — R030 Elevated blood-pressure reading, without diagnosis of hypertension: Secondary | ICD-10-CM | POA: Insufficient documentation

## 2021-08-13 DIAGNOSIS — J069 Acute upper respiratory infection, unspecified: Secondary | ICD-10-CM | POA: Insufficient documentation

## 2021-08-13 LAB — RESP PANEL BY RT-PCR (FLU A&B, COVID) ARPGX2
Influenza A by PCR: NEGATIVE
Influenza B by PCR: NEGATIVE
SARS Coronavirus 2 by RT PCR: NEGATIVE

## 2021-08-13 MED ORDER — GUAIFENESIN-CODEINE 100-10 MG/5ML PO SOLN: 5.0000 mL | ORAL | 0 refills | Status: DC | PRN

## 2021-08-13 MED ORDER — GUAIFENESIN-CODEINE 100-10 MG/5ML PO SOLN
5.0000 mL | ORAL | 0 refills | Status: DC | PRN
Start: 2021-08-13 — End: 2021-08-13

## 2021-08-13 MED ORDER — HYDROCODONE-ACETAMINOPHEN 5-325 MG PO TABS
1.0000 | ORAL_TABLET | Freq: Once | ORAL | Status: AC
  Administered 2021-08-13: 1 via ORAL
  Filled 2021-08-13: qty 1

## 2021-08-13 NOTE — ED Provider Notes (Signed)
[facility] Provider Note    Event Date/Time   First MD Initiated Contact with Patient 08/13/21 0730     (approximate)   History   Shortness of Breath and Headache   HPI  Margaret Dean is a 61 y.o. female   presents to the ED with complaint of cough, headache, generalized body aches and overall not feeling well for the last 3 to 4 days.  Patient has not done a home test for COVID.  She is a smoker and states that the phlegm is worse first thing when she gets up to go to work.  She denies any urinary symptoms, nausea or vomiting.  She has taken over-the-counter medication without any relief of her headache or body aches.  She also reports that the cough is keeping her awake.  She rates her pain as a 9 out of 10 presently.      Physical Exam   Triage Vital Signs: ED Triage Vitals  Enc Vitals Group     BP 08/13/21 0727 (!) 166/106     Pulse Rate 08/13/21 0727 66     Resp 08/13/21 0727 16     Temp 08/13/21 0727 98.3 F (36.8 C)     Temp src --      SpO2 08/13/21 0727 100 %     Weight 08/13/21 0725 170 lb (77.1 kg)     Height 08/13/21 0725 5\' 5"  (1.651 m)     Head Circumference --      Peak Flow --      Pain Score 08/13/21 0725 9     Pain Loc --      Pain Edu? --      Excl. in GC? --     Most recent vital signs: Vitals:   08/13/21 0727 08/13/21 0843  BP: (!) 166/106 (!) 184/113  Pulse: 66   Resp: 16   Temp: 98.3 F (36.8 C)   SpO2: 100%      General: Awake, no distress.  CV:  Good peripheral perfusion.  Heart regular rate and rhythm without murmur. Resp:  Normal effort.  Lungs are clear bilaterally. Abd:  No distention.      ED Results / Procedures / Treatments   Labs (all labs ordered are listed, but only abnormal results are displayed) Labs Reviewed  RESP PANEL BY RT-PCR (FLU A&B, COVID) [id number]     RADIOLOGY  2 view chest x-ray was reviewed by myself and no acute changes noted.  Radiology report was reviewed and reported  as no evidence of acute cardiopulmonary disease.   PROCEDURES:  Critical Care performed:   Procedures   MEDICATIONS ORDERED IN ED: Medications  HYDROcodone-acetaminophen (NORCO/VICODIN) 5-325 MG per tablet 1 tablet (1 tablet Oral Given 08/13/21 0841)     IMPRESSION / MDM / ASSESSMENT AND PLAN / ED COURSE  I reviewed the triage vital signs and the nursing notes.   Differential diagnosis includes, but is not limited to, influenza, COVID, viral upper respiratory infection, bronchitis, pneumonia.  Elevated blood pressure due to current illness versus hypertension.  61 year old female presents to the ED with complaints of cough, congestion, headache and body aches for the last 3 to 4 days.  Patient had an elevated blood pressure while in the ED but no diagnosis of hypertension.  She states that her family has hypertension.  She had an appointment with her PCP in December that she missed and has not rescheduled.  She is strongly encouraged to make a follow-up  appointment with her PCP and call on Monday.  Chest x-ray was reviewed by me and radiology report reviewed with no active cardiopulmonary disease.  COVID and influenza test were negative.  She was encouraged to drink lots of fluids to stay hydrated, a prescription for Robitussin-AC was sent to her pharmacy and continue ibuprofen or Tylenol as needed for body aches and headache.  She is return to the emergency department if any severe worsening of her symptoms over the weekend.     FINAL CLINICAL IMPRESSION(S) / ED DIAGNOSES   Final diagnoses:  Viral URI with cough  Elevated blood pressure reading     Rx / DC Orders   ED Discharge Orders          Ordered    guaiFENesin-codeine 100-10 MG/5ML syrup  Every 4 hours PRN,   Status:  Discontinued        08/13/21 0904    guaiFENesin-codeine 100-10 MG/5ML syrup  Every 4 hours PRN        08/13/21 8295             Note:  This document was prepared using Dragon voice recognition  software and may include unintentional dictation errors.   Tommi Rumps, PA-C 08/13/21 0912    Georga Hacking, MD 08/13/21 [phone]

## 2021-08-13 NOTE — ED Triage Notes (Signed)
Pt via POV from home. Pt c/o cough, headache, generalized body aches, cold sweats for the past 3-4 days. Has not been tested for COVID or flu. Pt is A&OX4 and NAD  Denies respiratory issues.

## 2021-08-13 NOTE — Discharge Instructions (Signed)
Call Monday to reschedule your appointment with your primary care provider and also have your blood pressure rechecked.  If you can keep a record of your blood pressure until you are seen by your primary care this will help determine whether your blood pressure is staying high on a daily basis and the need for medication.  A prescription for guaifenesin with codeine was sent to the pharmacy which will help with cough and congestion.  This medication contains a narcotic and should not be taken if you are planning on driving or operating machinery as it could cause drowsiness.  You may take Tylenol or ibuprofen along with this as needed.  Increase fluids to stay hydrated.

## 2022-11-03 ENCOUNTER — Ambulatory Visit: Payer: 59 | Admitting: Nurse Practitioner

## 2022-11-16 ENCOUNTER — Ambulatory Visit: Payer: 59 | Admitting: Nurse Practitioner

## 2022-12-04 IMAGING — CR DG CHEST 2V
2 series · 2 of 2 positions shown · non-contrast
Comparison: Chest x-ray 11/11/2019.

CLINICAL DATA: 60-year-old female with history of productive cough.

EXAM:
CHEST - 2 VIEW

[chest pa]
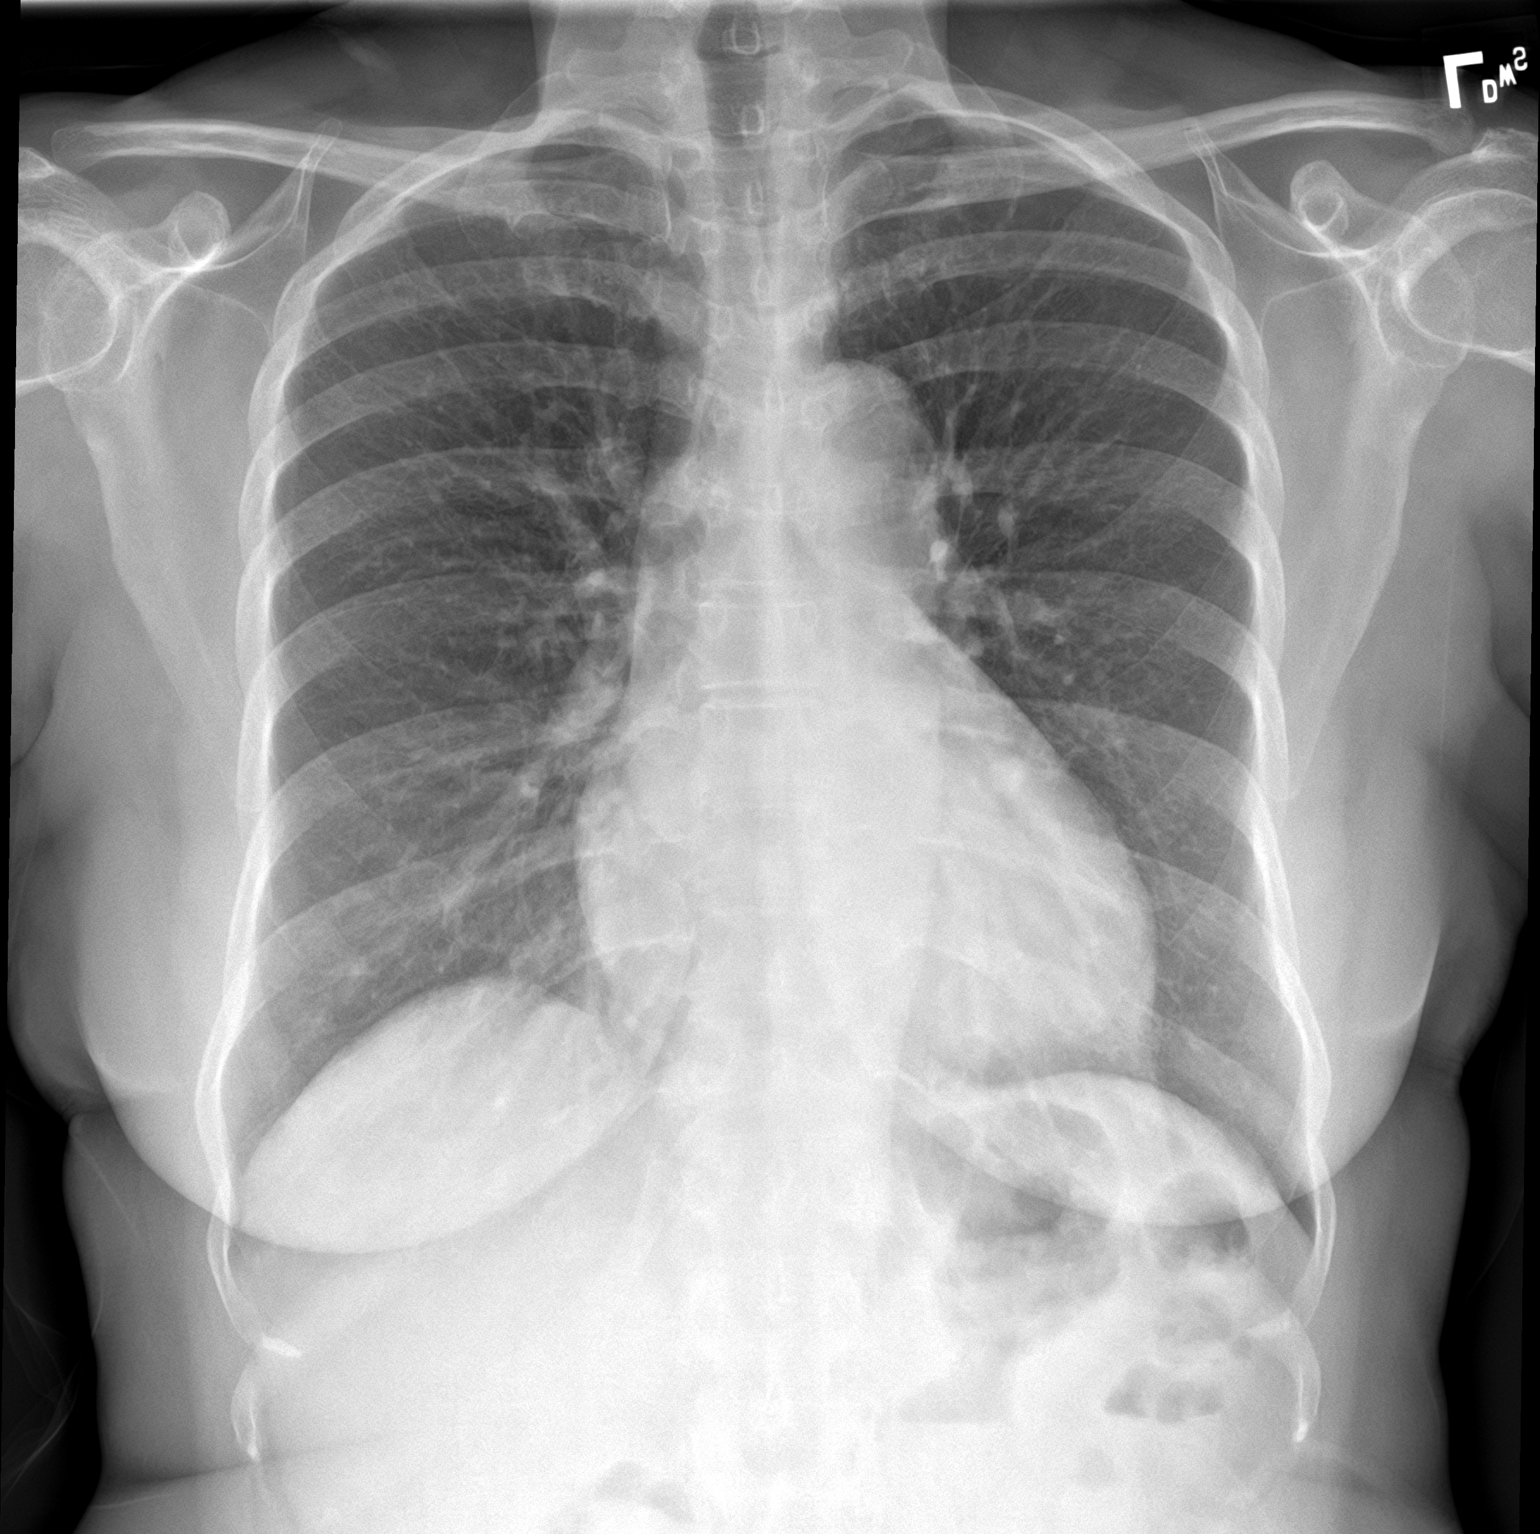

[chest lat]
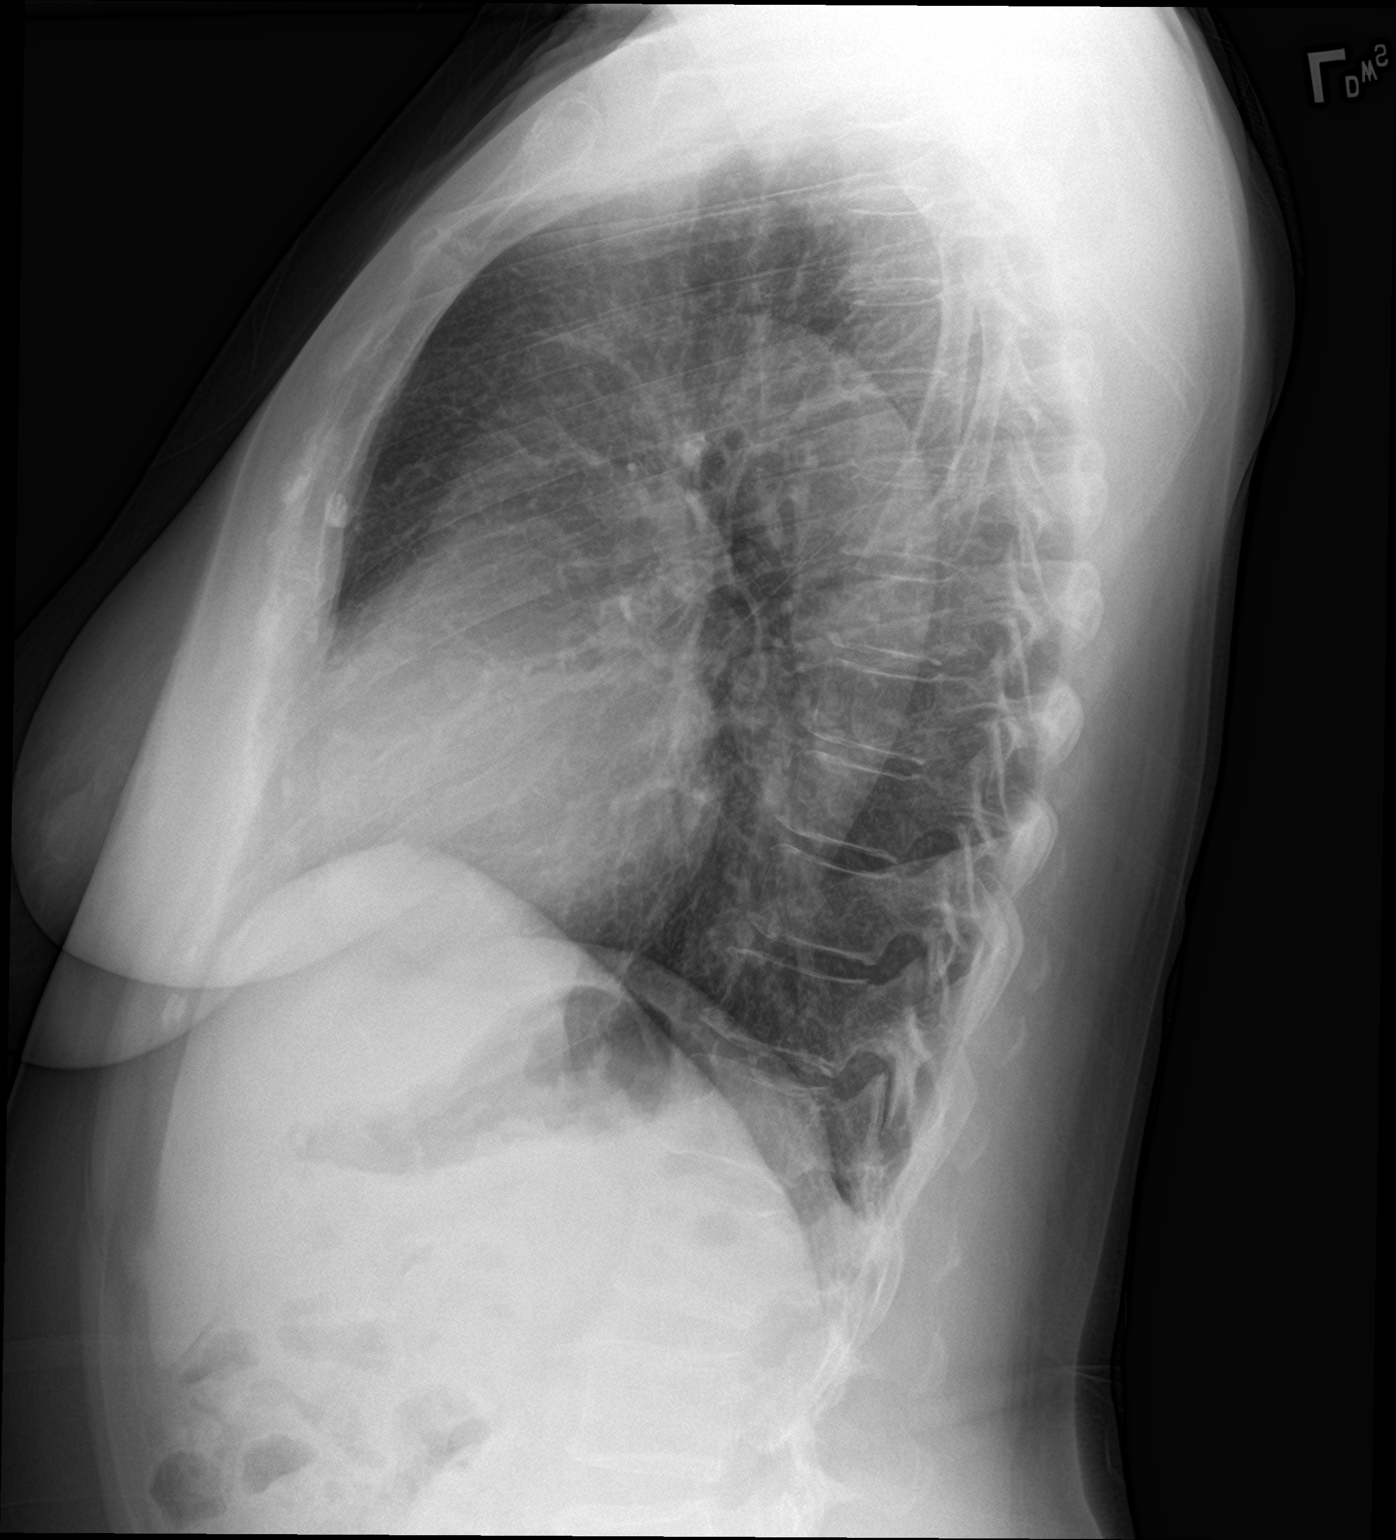

[2 of 2 positions shown; findings below may reference images not displayed]

FINDINGS: Lung volumes are normal. No consolidative airspace disease. No
pleural effusions. No pneumothorax. No pulmonary nodule or mass
noted. Pulmonary vasculature and the cardiomediastinal silhouette
are within normal limits.
IMPRESSION: No radiographic evidence of acute cardiopulmonary disease.

## 2023-06-29 ENCOUNTER — Emergency Department
Admission: EM | Admit: 2023-06-29 | Discharge: 2023-06-29 | Disposition: A | Discharge status: Home / Self Care | Payer: 59 | Attending: Emergency Medicine | Admitting: Emergency Medicine

## 2023-06-29 ENCOUNTER — Other Ambulatory Visit: Payer: Self-pay

## 2023-06-29 DIAGNOSIS — S4992XA Unspecified injury of left shoulder and upper arm, initial encounter: Secondary | ICD-10-CM | POA: Diagnosis present

## 2023-06-29 DIAGNOSIS — X500XXA Overexertion from strenuous movement or load, initial encounter: Secondary | ICD-10-CM | POA: Insufficient documentation

## 2023-06-29 DIAGNOSIS — S46912A Strain of unspecified muscle, fascia and tendon at shoulder and upper arm level, left arm, initial encounter: Secondary | ICD-10-CM | POA: Diagnosis not present

## 2023-06-29 DIAGNOSIS — Y990 Civilian activity done for income or pay: Secondary | ICD-10-CM | POA: Insufficient documentation

## 2023-06-29 MED ORDER — BACLOFEN 10 MG PO TABS: 10.0000 mg | ORAL_TABLET | Freq: Three times a day (TID) | ORAL | 0 refills | Status: AC

## 2023-06-29 MED ORDER — MELOXICAM 15 MG PO TABS: 15.0000 mg | ORAL_TABLET | Freq: Every day | ORAL | 2 refills | Status: DC

## 2023-06-29 NOTE — ED Triage Notes (Signed)
Pt c/o left neck pain that radiates down to her left arm and elbow. Hurts to move her neck to the left as well. Pt has been taking BC powder for the pain.

## 2023-06-29 NOTE — ED Provider Notes (Signed)
[facility] Provider Note    Event Date/Time   First MD Initiated Contact with Patient 06/29/23 0840     (approximate)   History   Neck Pain   HPI  Margaret Dean is a 62 y.o. female with no significant past medical history presents emergency department with left shoulder pain for 1 week.  Patient states she thinks she overworked hip.  Does a lot of lifting at work and reaches overhead.  States pain is in the rotator cuff only when she turns her neck.  No chest pain or shortness of breath.      Physical Exam   Triage Vital Signs: ED Triage Vitals  Encounter Vitals Group     BP 06/29/23 0829 (!) 151/97     Systolic BP Percentile --      Diastolic BP Percentile --      Pulse Rate 06/29/23 0829 61     Resp 06/29/23 0829 18     Temp 06/29/23 0829 98.5 F (36.9 C)     Temp Source 06/29/23 0829 Oral     SpO2 06/29/23 0829 97 %     Weight 06/29/23 0829 170 lb (77.1 kg)     Height 06/29/23 0829 5\' 5"  (1.651 m)     Head Circumference --      Peak Flow --      Pain Score 06/29/23 0832 10     Pain Loc --      Pain Education --      Exclude from Growth Chart --     Most recent vital signs: Vitals:   06/29/23 0829  BP: (!) 151/97  Pulse: 61  Resp: 18  Temp: 98.5 F (36.9 C)  SpO2: 97%     General: Awake, no distress.   CV:  Good peripheral perfusion. regular rate and  rhythm Resp:  Normal effort. Abd:  No distention.   Other:  Muscles are spasmed in the trapezius, tender in the left rotator cuff, patient does have full range of motion although pain is reproduced with rotation of the neck   ED Results / Procedures / Treatments   Labs (all labs ordered are listed, but only abnormal results are displayed) Labs Reviewed - No data to display   EKG     RADIOLOGY     PROCEDURES:   Procedures   MEDICATIONS ORDERED IN ED: Medications - No data to display   IMPRESSION / MDM / ASSESSMENT AND PLAN / ED COURSE  I reviewed the  triage vital signs and the nursing notes.                              Differential diagnosis includes, but is not limited to, muscle strain, rotator cuff tear, rotator cuff tendinitis  Patient's presentation is most consistent with acute, uncomplicated illness.   Patient's exam is consistent with a rotator cuff strain.  I did explain these findings to patient.  Will place her on meloxicam and baclofen.  She is to follow-up with orthopedics.  Patient is in agreement treatment plan.  Discharged stable condition.      FINAL CLINICAL IMPRESSION(S) / ED DIAGNOSES   Final diagnoses:  Shoulder strain, left, initial encounter     Rx / DC Orders   ED Discharge Orders          Ordered    meloxicam (MOBIC) 15 MG tablet  Daily  06/29/23 0841    baclofen (LIORESAL) 10 MG tablet  3 times daily        06/29/23 6213             Note:  This document was prepared using Dragon voice recognition software and may include unintentional dictation errors.    Faythe Ghee, PA-C 06/29/23 0865    Corena Herter, MD 06/29/23 [phone]

## 2023-07-11 ENCOUNTER — Ambulatory Visit: Payer: Commercial Managed Care - PPO | Admitting: Cardiology

## 2023-07-11 ENCOUNTER — Ambulatory Visit
Admission: RE | Admit: 2023-07-11 | Discharge: 2023-07-11 | Disposition: A | Discharge status: Home / Self Care | Payer: 59 | Source: Ambulatory Visit | Attending: Cardiology | Admitting: Cardiology

## 2023-07-11 ENCOUNTER — Encounter: Payer: Self-pay | Admitting: Cardiology

## 2023-07-11 ENCOUNTER — Ambulatory Visit
Admission: RE | Admit: 2023-07-11 | Discharge: 2023-07-11 | Disposition: A | Discharge status: Home / Self Care | Payer: 59 | Attending: Cardiology | Admitting: Cardiology

## 2023-07-11 VITALS — BP 124/86 | HR 81 | Ht 65.0 in | Wt 167.0 lb

## 2023-07-11 DIAGNOSIS — M542 Cervicalgia: Secondary | ICD-10-CM | POA: Insufficient documentation

## 2023-07-11 DIAGNOSIS — E559 Vitamin D deficiency, unspecified: Secondary | ICD-10-CM

## 2023-07-11 DIAGNOSIS — Z1211 Encounter for screening for malignant neoplasm of colon: Secondary | ICD-10-CM

## 2023-07-11 DIAGNOSIS — R200 Anesthesia of skin: Secondary | ICD-10-CM

## 2023-07-11 DIAGNOSIS — Z7689 Persons encountering health services in other specified circumstances: Secondary | ICD-10-CM

## 2023-07-11 DIAGNOSIS — E782 Mixed hyperlipidemia: Secondary | ICD-10-CM

## 2023-07-11 DIAGNOSIS — R202 Paresthesia of skin: Secondary | ICD-10-CM

## 2023-07-11 DIAGNOSIS — Z131 Encounter for screening for diabetes mellitus: Secondary | ICD-10-CM

## 2023-07-11 DIAGNOSIS — Z1329 Encounter for screening for other suspected endocrine disorder: Secondary | ICD-10-CM

## 2023-07-11 DIAGNOSIS — Z1231 Encounter for screening mammogram for malignant neoplasm of breast: Secondary | ICD-10-CM

## 2023-07-11 NOTE — Progress Notes (Signed)
 Established Patient Office Visit  Subjective:  Patient ID: Margaret Dean, female    DOB: Mar 08, 1961  Age: 63 y.o. MRN: [id number]  Chief Complaint  Patient presents with   Establish Care    Re-establish care    Patient in office to re-establish care.  Left shoulder pain, left foot ring toe cramping. Started about 2 weeks ago. Tingling and numbness of left arm. Went to ED on 06/29/23, given a muscle relaxer and iburpofen, no relief per patient. Will order a c-spine xray, will order PT if normal.  Patient has never had a mammogram, will send order today, patient states she has had discharge from her left nipple. Colonoscopy, never done, will send referral.  Patient not fasting, will return for blood work. Discussed GAD7 score, patient reports feeling anxious due to recent medical concerns.     No other concerns at this time.   History reviewed. No pertinent past medical history.  History reviewed. No pertinent surgical history.  Social History   Socioeconomic History   Marital status: Single    Spouse name: Not on file   Number of children: Not on file   Years of education: Not on file   Highest education level: Not on file  Occupational History   Not on file  Tobacco Use   Smoking status: Every Day    Current packs/day: 0.50    Types: Cigarettes   Smokeless tobacco: Not on file  Substance and Sexual Activity   Alcohol use: Not on file    Comment: pt reports having a glass of wine once a month   Drug use: No   Sexual activity: Not on file  Other Topics Concern   Not on file  Social History Narrative   Not on file   Social Drivers of Health   Financial Resource Strain: Not on file  Food Insecurity: Not on file  Transportation Needs: Not on file  Physical Activity: Not on file  Stress: Not on file  Social Connections: Not on file  Intimate Partner Violence: Not on file    History reviewed. No pertinent family history.  No Known Allergies  Outpatient  Medications Prior to Visit  Medication Sig   guaiFENesin -codeine  100-10 MG/5ML syrup Take 5 mLs by mouth every 4 (four) hours as needed. (Patient not taking: Reported on 07/11/2023)   meloxicam  (MOBIC ) 15 MG tablet Take 1 tablet (15 mg total) by mouth daily. (Patient not taking: Reported on 07/11/2023)   No facility-administered medications prior to visit.    Review of Systems  Constitutional: Negative.   HENT: Negative.    Eyes: Negative.   Respiratory: Negative.  Negative for shortness of breath.   Cardiovascular: Negative.  Negative for chest pain.  Gastrointestinal: Negative.  Negative for abdominal pain, constipation and diarrhea.  Genitourinary: Negative.   Musculoskeletal:  Positive for myalgias and neck pain. Negative for joint pain.  Skin: Negative.   Neurological:  Positive for tingling. Negative for dizziness and headaches.  Endo/Heme/Allergies: Negative.   All other systems reviewed and are negative.   Flowsheet Row Office Visit from 07/11/2023 in [facility]  Thoughts that you would be better off dead, or of hurting yourself in some way Not at all  PHQ-9 Total Score 3         07/11/2023   11:40 AM  GAD 7 : Generalized Anxiety Score  Nervous, Anxious, on Edge 0  Control/stop worrying 3  Worry too much - different things 3  Trouble relaxing 3  Restless 0  Easily annoyed or irritable 0  Afraid - awful might happen 0  Total GAD 7 Score 9         Objective:   BP 124/86   Pulse 81   Ht 5' 5 (1.651 m)   Wt 167 lb (75.8 kg)   LMP 11/02/2015   SpO2 98%   BMI 27.79 kg/m   Vitals:   07/11/23 1046  BP: 124/86  Pulse: 81  Height: 5' 5 (1.651 m)  Weight: 167 lb (75.8 kg)  SpO2: 98%  BMI (Calculated): 27.79    Physical Exam Vitals and nursing note reviewed.  Constitutional:      Appearance: Normal appearance. She is normal weight.  HENT:     Head: Normocephalic and atraumatic.     Nose: Nose normal.     Mouth/Throat:     Mouth: Mucous  membranes are moist.  Eyes:     Extraocular Movements: Extraocular movements intact.     Conjunctiva/sclera: Conjunctivae normal.     Pupils: Pupils are equal, round, and reactive to light.  Cardiovascular:     Rate and Rhythm: Normal rate and regular rhythm.     Pulses: Normal pulses.     Heart sounds: Normal heart sounds.  Pulmonary:     Effort: Pulmonary effort is normal.     Breath sounds: Normal breath sounds.  Abdominal:     General: Abdomen is flat. Bowel sounds are normal.     Palpations: Abdomen is soft.  Musculoskeletal:        General: Normal range of motion.     Cervical back: Normal range of motion.  Skin:    General: Skin is warm and dry.  Neurological:     General: No focal deficit present.     Mental Status: She is alert and oriented to person, place, and time.  Psychiatric:        Mood and Affect: Mood normal.        Behavior: Behavior normal.        Thought Content: Thought content normal.        Judgment: Judgment normal.      No results found for any visits on 07/11/23.  No results found for this or any previous visit (from the past 2160 hours).    Assessment & Plan:  C-spine xray Mammogram order sent Referral for colonoscopy sent Return for fasting lab work  Problem List Items Addressed This Visit       Other   Encounter to establish care - Primary   Vitamin D  deficiency   Relevant Orders   VITAMIN D  25 Hydroxy (Vit-D Deficiency, Fractures)   Mixed hyperlipidemia   Relevant Orders   Lipid panel   Other Visit Diagnoses       Colon cancer screening       Relevant Orders   Ambulatory referral to Gastroenterology     Neck pain       Relevant Orders   DG Cervical Spine Complete     Thyroid disorder screening       Relevant Orders   TSH     Diabetes mellitus screening       Relevant Orders   Hemoglobin A1c   CMP14+EGFR     Numbness and tingling in left arm       Relevant Orders   CBC with Differential/Platelet   Vitamin B12      Encounter for screening mammogram for malignant neoplasm of breast  Relevant Orders   MM 3D SCREENING MAMMOGRAM BILATERAL BREAST       Return in about 2 weeks (around 07/25/2023).   Total time spent: 25 minutes  Google, NP  07/11/2023   This document may have been prepared by Dragon Voice Recognition software and as such may include unintentional dictation errors.

## 2023-07-23 ENCOUNTER — Other Ambulatory Visit: Payer: Commercial Managed Care - PPO

## 2023-07-23 DIAGNOSIS — E559 Vitamin D deficiency, unspecified: Secondary | ICD-10-CM

## 2023-07-23 DIAGNOSIS — Z131 Encounter for screening for diabetes mellitus: Secondary | ICD-10-CM

## 2023-07-23 DIAGNOSIS — R202 Paresthesia of skin: Secondary | ICD-10-CM

## 2023-07-23 DIAGNOSIS — Z1329 Encounter for screening for other suspected endocrine disorder: Secondary | ICD-10-CM

## 2023-07-23 DIAGNOSIS — E782 Mixed hyperlipidemia: Secondary | ICD-10-CM

## 2023-07-24 ENCOUNTER — Telehealth: Payer: Self-pay | Admitting: *Deleted

## 2023-07-24 ENCOUNTER — Other Ambulatory Visit: Payer: Self-pay | Admitting: *Deleted

## 2023-07-24 DIAGNOSIS — Z1211 Encounter for screening for malignant neoplasm of colon: Secondary | ICD-10-CM

## 2023-07-24 LAB — CMP14+EGFR
ALT: 16 [IU]/L (ref 0–32)
AST: 17 [IU]/L (ref 0–40)
Albumin: 4.4 g/dL (ref 3.9–4.9)
Alkaline Phosphatase: 63 [IU]/L (ref 44–121)
BUN/Creatinine Ratio: 11 — ABNORMAL LOW (ref 12–28)
BUN: 11 mg/dL (ref 8–27)
Bilirubin Total: 0.3 mg/dL (ref 0.0–1.2)
CO2: 22 mmol/L (ref 20–29)
Calcium: 10.8 mg/dL — ABNORMAL HIGH (ref 8.7–10.3)
Chloride: 100 mmol/L (ref 96–106)
Creatinine, Ser: 0.99 mg/dL (ref 0.57–1.00)
Globulin, Total: 2.6 g/dL (ref 1.5–4.5)
Glucose: 80 mg/dL (ref 70–99)
Potassium: 4.7 mmol/L (ref 3.5–5.2)
Sodium: 139 mmol/L (ref 134–144)
Total Protein: 7 g/dL (ref 6.0–8.5)
eGFR: 65 mL/min/{1.73_m2} (ref >59)

## 2023-07-24 LAB — CBC WITH DIFFERENTIAL/PLATELET
Basophils Absolute: 0.1 10*3/uL (ref 0.0–0.2)
Basos: 1 %
EOS (ABSOLUTE): 0.2 10*3/uL (ref 0.0–0.4)
Eos: 4 %
Hematocrit: 42.2 % (ref 34.0–46.6)
Hemoglobin: 14.1 g/dL (ref 11.1–15.9)
Immature Grans (Abs): 0 10*3/uL (ref 0.0–0.1)
Immature Granulocytes: 0 %
Lymphocytes Absolute: 3.2 10*3/uL — ABNORMAL HIGH (ref 0.7–3.1)
Lymphs: 57 %
MCH: 32.8 pg (ref 26.6–33.0)
MCHC: 33.4 g/dL (ref 31.5–35.7)
MCV: 98 fL — ABNORMAL HIGH (ref 79–97)
Monocytes Absolute: 0.4 10*3/uL (ref 0.1–0.9)
Monocytes: 7 %
Neutrophils Absolute: 1.7 10*3/uL (ref 1.4–7.0)
Neutrophils: 31 %
Platelets: 239 10*3/uL (ref 150–450)
RBC: 4.3 x10E6/uL (ref 3.77–5.28)
RDW: 12.8 % (ref 11.7–15.4)
WBC: 5.6 10*3/uL (ref 3.4–10.8)

## 2023-07-24 LAB — HEMOGLOBIN A1C
Est. average glucose Bld gHb Est-mCnc: 117 mg/dL
Hgb A1c MFr Bld: 5.7 % — ABNORMAL HIGH (ref 4.8–5.6)

## 2023-07-24 LAB — LIPID PANEL
Chol/HDL Ratio: 2.5 {ratio} (ref 0.0–4.4)
Cholesterol, Total: 236 mg/dL — ABNORMAL HIGH (ref 100–199)
HDL: 96 mg/dL (ref >39)
LDL Chol Calc (NIH): 130 mg/dL — ABNORMAL HIGH (ref 0–99)
Triglycerides: 59 mg/dL (ref 0–149)
VLDL Cholesterol Cal: 10 mg/dL (ref 5–40)

## 2023-07-24 LAB — VITAMIN D 25 HYDROXY (VIT D DEFICIENCY, FRACTURES): Vit D, 25-Hydroxy: 8.6 ng/mL — ABNORMAL LOW (ref 30.0–100.0)

## 2023-07-24 LAB — VITAMIN B12: Vitamin B-12: 527 pg/mL (ref 232–1245)

## 2023-07-24 LAB — TSH: TSH: 1.67 u[IU]/mL (ref 0.450–4.500)

## 2023-07-24 MED ORDER — NA SULFATE-K SULFATE-MG SULF 17.5-3.13-1.6 GM/177ML PO SOLN: 1.0000 | Freq: Once | ORAL | 0 refills | Status: AC

## 2023-07-24 NOTE — Telephone Encounter (Signed)
Gastroenterology Pre-Procedure Review  Request Date: 08/14/2023 Requesting Physician: Dr. Allegra Lai  PATIENT REVIEW QUESTIONS: The patient responded to the following health history questions as indicated:    1. Are you having any GI issues? no 2. Do you have a personal history of Polyps? no 3. Do you have a family history of Colon Cancer or Polyps? no 4. Diabetes Mellitus? no 5. Joint replacements in the past 12 months?no 6. Major health problems in the past 3 months?no 7. Any artificial heart valves, MVP, or defibrillator?no    MEDICATIONS & ALLERGIES:    Patient reports the following regarding taking any anticoagulation/antiplatelet therapy:   Plavix, Coumadin, Eliquis, Xarelto, Lovenox, Pradaxa, Brilinta, or Effient? no Aspirin? no  Patient confirms/reports the following medications:  Current Outpatient Medications  Medication Sig Dispense Refill   guaiFENesin-codeine 100-10 MG/5ML syrup Take 5 mLs by mouth every 4 (four) hours as needed. (Patient not taking: Reported on 07/11/2023) 120 mL 0   meloxicam (MOBIC) 15 MG tablet Take 1 tablet (15 mg total) by mouth daily. (Patient not taking: Reported on 07/11/2023) 30 tablet 2   No current facility-administered medications for this visit.    Patient confirms/reports the following allergies:  No Known Allergies  No orders of the defined types were placed in this encounter.   AUTHORIZATION INFORMATION Primary Insurance: 1D#: Group #:  Secondary Insurance: 1D#: Group #:  SCHEDULE INFORMATION: Date: 08/14/2023 Time: Location: [facility]

## 2023-07-25 ENCOUNTER — Ambulatory Visit: Payer: Commercial Managed Care - PPO | Admitting: Cardiology

## 2023-07-26 ENCOUNTER — Other Ambulatory Visit: Payer: Self-pay

## 2023-07-26 ENCOUNTER — Encounter: Payer: Self-pay | Admitting: Gastroenterology

## 2023-07-27 ENCOUNTER — Encounter: Payer: Self-pay | Admitting: Cardiology

## 2023-07-27 ENCOUNTER — Ambulatory Visit: Payer: Commercial Managed Care - PPO | Admitting: Cardiology

## 2023-07-27 ENCOUNTER — Ambulatory Visit (INDEPENDENT_AMBULATORY_CARE_PROVIDER_SITE_OTHER): Payer: Commercial Managed Care - PPO | Admitting: Cardiology

## 2023-07-27 VITALS — BP 164/94 | HR 69 | Ht 65.0 in | Wt 163.0 lb

## 2023-07-27 DIAGNOSIS — I10 Essential (primary) hypertension: Secondary | ICD-10-CM

## 2023-07-27 DIAGNOSIS — R200 Anesthesia of skin: Secondary | ICD-10-CM | POA: Insufficient documentation

## 2023-07-27 DIAGNOSIS — R202 Paresthesia of skin: Secondary | ICD-10-CM | POA: Diagnosis not present

## 2023-07-27 DIAGNOSIS — R7303 Prediabetes: Secondary | ICD-10-CM | POA: Diagnosis not present

## 2023-07-27 DIAGNOSIS — E559 Vitamin D deficiency, unspecified: Secondary | ICD-10-CM

## 2023-07-27 MED ORDER — VITAMIN D (ERGOCALCIFEROL) 1.25 MG (50000 UNIT) PO CAPS: 50000.0000 [IU] | ORAL_CAPSULE | ORAL | 11 refills | Status: AC

## 2023-07-27 NOTE — Patient Instructions (Signed)
[facility] at [facility] [address], [address] [place],  [place]  [address]  Main: [phone]

## 2023-07-27 NOTE — Progress Notes (Signed)
Established Patient Office Visit  Subjective:  Patient ID: Margaret Dean, female    DOB: 1960-10-12  Age: 63 y.o. MRN: [id number]  Chief Complaint  Patient presents with   Follow-up    2 weeks follow up    Patient in office for 2 week follow up, discuss lab results and CT results.  Patient continues to have left arm numbness and tingling. 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 Will refer to physical therapy.  Discussed recent lab work. Vitamin D level low, will start patient on weekly vitamin D supplement. Hgb A1c elevated, pre-diabetic.  Patient scheduled for colonoscopy. Did not hear from mammogram facility, will give phone number to patient today.     No other concerns at this time.   Past Medical History:  Diagnosis Date   Hyperlipidemia    Vitamin D deficiency     History reviewed. No pertinent surgical history.  Social History   Socioeconomic History   Marital status: Single    Spouse name: Not on file   Number of children: Not on file   Years of education: Not on file   Highest education level: Not on file  Occupational History   Not on file  Tobacco Use   Smoking status: Some Days    Current packs/day: 0.50    Types: Cigarettes   Smokeless tobacco: Not on file  Substance and Sexual Activity   Alcohol use: Not on file    Comment: pt reports having a glass of wine once a month   Drug use: No   Sexual activity: Not on file  Other Topics Concern   Not on file  Social History Narrative   Not on file   Social Drivers of Health   Financial Resource Strain: Not on file  Food Insecurity: Not on file  Transportation Needs: Not on file  Physical Activity: Not on file  Stress: Not on file  Social Connections: Not on file  Intimate Partner Violence: Not on file    History reviewed. No pertinent family history.  No Known Allergies  Outpatient Medications Prior to Visit  Medication Sig   [DISCONTINUED] guaiFENesin-codeine  100-10 MG/5ML syrup Take 5 mLs by mouth every 4 (four) hours as needed. (Patient not taking: Reported on 07/27/2023)   [DISCONTINUED] meloxicam (MOBIC) 15 MG tablet Take 1 tablet (15 mg total) by mouth daily. (Patient not taking: Reported on 07/27/2023)   No facility-administered medications prior to visit.    Review of Systems  Constitutional: Negative.   HENT: Negative.    Eyes: Negative.   Respiratory: Negative.  Negative for shortness of breath.   Cardiovascular: Negative.  Negative for chest pain.  Gastrointestinal: Negative.  Negative for abdominal pain, constipation and diarrhea.  Genitourinary: Negative.   Musculoskeletal:  Negative for joint pain and myalgias.  Skin: Negative.   Neurological:  Positive for tingling. Negative for dizziness and headaches.  Endo/Heme/Allergies: Negative.   All other systems reviewed and are negative.      Objective:   BP (!) 164/94   Pulse 69   Ht 5\' 5"  (1.651 m)   Wt 163 lb (73.9 kg)   LMP 11/02/2015   SpO2 99%   BMI 27.12 kg/m   Vitals:   07/27/23 1031  BP: (!) 164/94  Pulse: 69  Height: 5\' 5"  (1.651 m)  Weight: 163 lb (73.9 kg)  SpO2: 99%  BMI (Calculated): 27.12    Physical Exam Vitals and nursing note reviewed.  Constitutional:  Appearance: Normal appearance. She is normal weight.  HENT:     Head: Normocephalic and atraumatic.     Nose: Nose normal.     Mouth/Throat:     Mouth: Mucous membranes are moist.  Eyes:     Extraocular Movements: Extraocular movements intact.     Conjunctiva/sclera: Conjunctivae normal.     Pupils: Pupils are equal, round, and reactive to light.  Cardiovascular:     Rate and Rhythm: Normal rate and regular rhythm.     Pulses: Normal pulses.     Heart sounds: Normal heart sounds.  Pulmonary:     Effort: Pulmonary effort is normal.     Breath sounds: Normal breath sounds.  Abdominal:     General: Abdomen is flat. Bowel sounds are normal.     Palpations: Abdomen is soft.   Musculoskeletal:        General: Normal range of motion.     Cervical back: Normal range of motion.  Skin:    General: Skin is warm and dry.  Neurological:     General: No focal deficit present.     Mental Status: She is alert and oriented to person, place, and time.  Psychiatric:        Mood and Affect: Mood normal.        Behavior: Behavior normal.        Thought Content: Thought content normal.        Judgment: Judgment normal.      No results found for any visits on 07/27/23.  Recent Results (from the past 2160 hours)  Vitamin B12     Status: None   Collection Time: 07/23/23  8:43 AM  Result Value Ref Range   Vitamin B-12 527 232 - 1,245 pg/mL  CMP14+EGFR     Status: Abnormal   Collection Time: 07/23/23  8:43 AM  Result Value Ref Range   Glucose 80 70 - 99 mg/dL   BUN 11 8 - 27 mg/dL   Creatinine, Ser 0.16 0.57 - 1.00 mg/dL   eGFR 65 >01 UX/NAT/5.57   BUN/Creatinine Ratio 11 (L) 12 - 28   Sodium 139 134 - 144 mmol/L   Potassium 4.7 3.5 - 5.2 mmol/L   Chloride 100 96 - 106 mmol/L   CO2 22 20 - 29 mmol/L   Calcium 10.8 (H) 8.7 - 10.3 mg/dL   Total Protein 7.0 6.0 - 8.5 g/dL   Albumin 4.4 3.9 - 4.9 g/dL   Globulin, Total 2.6 1.5 - 4.5 g/dL   Bilirubin Total 0.3 0.0 - 1.2 mg/dL   Alkaline Phosphatase 63 44 - 121 IU/L   AST 17 0 - 40 IU/L   ALT 16 0 - 32 IU/L  VITAMIN D 25 Hydroxy (Vit-D Deficiency, Fractures)     Status: Abnormal   Collection Time: 07/23/23  8:43 AM  Result Value Ref Range   Vit D, 25-Hydroxy 8.6 (L) 30.0 - 100.0 ng/mL    Comment: Vitamin D deficiency has been defined by the Institute of Medicine and an Endocrine Society practice guideline as a level of serum 25-OH vitamin D less than 20 ng/mL (1,2). The Endocrine Society went on to further define vitamin D insufficiency as a level between 21 and 29 ng/mL (2). 1. IOM (Institute of Medicine). 2010. Dietary reference    intakes for calcium and D. [place]: The    [employer]. 2. Holick MF, Binkley , Bischoff-Ferrari HA, et al.    Evaluation, treatment, and prevention of vitamin  D    deficiency: an Endocrine Society clinical practice    guideline. JCEM. 2011 Jul; 96(7):1911-30.   CBC with Differential/Platelet     Status: Abnormal   Collection Time: 07/23/23  8:43 AM  Result Value Ref Range   WBC 5.6 3.4 - 10.8 x10E3/uL   RBC 4.30 3.77 - 5.28 x10E6/uL   Hemoglobin 14.1 11.1 - 15.9 g/dL   Hematocrit 78.2 95.6 - 46.6 %   MCV 98 (H) 79 - 97 fL   MCH 32.8 26.6 - 33.0 pg   MCHC 33.4 31.5 - 35.7 g/dL   RDW 21.3 08.6 - 57.8 %   Platelets 239 150 - 450 x10E3/uL   Neutrophils 31 Not Estab. %   Lymphs 57 Not Estab. %   Monocytes 7 Not Estab. %   Eos 4 Not Estab. %   Basos 1 Not Estab. %   Neutrophils Absolute 1.7 1.4 - 7.0 x10E3/uL   Lymphocytes Absolute 3.2 (H) 0.7 - 3.1 x10E3/uL   Monocytes Absolute 0.4 0.1 - 0.9 x10E3/uL   EOS (ABSOLUTE) 0.2 0.0 - 0.4 x10E3/uL   Basophils Absolute 0.1 0.0 - 0.2 x10E3/uL   Immature Granulocytes 0 Not Estab. %   Immature Grans (Abs) 0.0 0.0 - 0.1 x10E3/uL  TSH     Status: None   Collection Time: 07/23/23  8:43 AM  Result Value Ref Range   TSH 1.670 0.450 - 4.500 uIU/mL  Hemoglobin A1c     Status: Abnormal   Collection Time: 07/23/23  8:43 AM  Result Value Ref Range   Hgb A1c MFr Bld 5.7 (H) 4.8 - 5.6 %    Comment:          Prediabetes: 5.7 - 6.4          Diabetes: >6.4          Glycemic control for adults with diabetes: <7.0    Est. average glucose Bld gHb Est-mCnc 117 mg/dL  Lipid panel     Status: Abnormal   Collection Time: 07/23/23  8:43 AM  Result Value Ref Range   Cholesterol, Total 236 (H) 100 - 199 mg/dL   Triglycerides 59 0 - 149 mg/dL   HDL 96 >46 mg/dL   VLDL Cholesterol Cal 10 5 - 40 mg/dL   LDL Chol Calc (NIH) 962 (H) 0 - 99 mg/dL   Chol/HDL Ratio 2.5 0.0 - 4.4 ratio    Comment:                                   T. Chol/HDL Ratio                                             Men  Women                                1/2 Avg.Risk  3.4    3.3                                   Avg.Risk  5.0    4.4  2X Avg.Risk  9.6    7.1                                3X Avg.Risk 23.4   11.0       Assessment & Plan:  Referral sent to PT. Start vitamin D supplement. Work on decreasing sugar and starches for prediabetes.  Call to schedule mammogram.  Problem List Items Addressed This Visit       Other   Vitamin D deficiency   Numbness and tingling in left arm - Primary   Relevant Orders   Ambulatory referral to Physical Therapy   Prediabetes    Return in about 2 months (around 09/24/2023).   Total time spent: 25 minutes  Google, NP  07/27/2023   This document may have been prepared by Dragon Voice Recognition software and as such may include unintentional dictation errors.

## 2023-08-01 ENCOUNTER — Encounter: Payer: Self-pay | Admitting: Anesthesiology

## 2023-08-08 ENCOUNTER — Telehealth: Payer: Self-pay

## 2023-08-08 NOTE — Telephone Encounter (Signed)
 Called patient but she is at the store with her mom and aunt. Will call back later.

## 2023-08-08 NOTE — Telephone Encounter (Signed)
 Pt requesting call back to cancel colonoscopy for 08/14/2023 and reschedule

## 2023-08-09 MED ORDER — NA SULFATE-K SULFATE-MG SULF 17.5-3.13-1.6 GM/177ML PO SOLN: 1.0000 | Freq: Once | ORAL | 0 refills | Status: AC

## 2023-08-09 NOTE — Addendum Note (Signed)
 Addended by: Madalen Gavin on: 08/09/2023 10:43 AM   Modules accepted: Orders

## 2023-08-09 NOTE — Telephone Encounter (Signed)
 Spoken to patient and she is requesting to reschedule to 09/04/2023.  Message sent to Burdette Carolin at [facility] to make the change.  New instructions will be sent.

## 2023-08-29 ENCOUNTER — Telehealth: Payer: Self-pay | Admitting: *Deleted

## 2023-08-29 NOTE — Telephone Encounter (Signed)
 Per Octaviano Batty RN at [facility]  Dr Allegra Lai pt for 3/4. Said she needs to reschedule because she will be out of town next week. could someone please give her a call? Thanks.   I have called patient this morning and this afternoon. She have not return my call.

## 2023-08-30 NOTE — Telephone Encounter (Signed)
 Message left for patient to return my call.

## 2023-09-03 NOTE — Telephone Encounter (Signed)
 Per Selena Batten at [facility] surgery center on 08/31/2023. Patient will call back later to reschedule.

## 2023-09-04 ENCOUNTER — Ambulatory Visit
Admission: RE | Admit: 2023-09-04 | Payer: Commercial Managed Care - PPO | Source: Home / Self Care | Admitting: Gastroenterology

## 2023-09-04 SURGERY — COLONOSCOPY WITH PROPOFOL
Anesthesia: Choice

## 2023-09-24 ENCOUNTER — Ambulatory Visit: Payer: Commercial Managed Care - PPO | Admitting: Cardiology

## 2023-10-03 ENCOUNTER — Other Ambulatory Visit: Payer: Self-pay | Admitting: Cardiology

## 2023-10-03 DIAGNOSIS — Z1231 Encounter for screening mammogram for malignant neoplasm of breast: Secondary | ICD-10-CM

## 2023-10-09 ENCOUNTER — Other Ambulatory Visit: Payer: Self-pay | Admitting: Cardiology

## 2023-10-09 DIAGNOSIS — Z1211 Encounter for screening for malignant neoplasm of colon: Secondary | ICD-10-CM

## 2023-10-09 DIAGNOSIS — Z1231 Encounter for screening mammogram for malignant neoplasm of breast: Secondary | ICD-10-CM

## 2023-10-09 DIAGNOSIS — Z131 Encounter for screening for diabetes mellitus: Secondary | ICD-10-CM

## 2023-10-09 DIAGNOSIS — E782 Mixed hyperlipidemia: Secondary | ICD-10-CM

## 2023-10-09 DIAGNOSIS — E559 Vitamin D deficiency, unspecified: Secondary | ICD-10-CM

## 2023-10-09 DIAGNOSIS — Z1329 Encounter for screening for other suspected endocrine disorder: Secondary | ICD-10-CM

## 2023-10-09 DIAGNOSIS — R200 Anesthesia of skin: Secondary | ICD-10-CM

## 2023-10-09 DIAGNOSIS — M542 Cervicalgia: Secondary | ICD-10-CM

## 2023-10-09 DIAGNOSIS — Z7689 Persons encountering health services in other specified circumstances: Secondary | ICD-10-CM

## 2023-10-09 DIAGNOSIS — N6452 Nipple discharge: Secondary | ICD-10-CM
